# Patient Record
Sex: Male | Born: 1995 | Race: White | Hispanic: No | Marital: Single | Smoking: Never smoker
Health system: Southern US, Community
[De-identification: ages and names within clinical notes are randomized; demographics above are authoritative.]

## PROBLEM LIST (undated history)

## (undated) DIAGNOSIS — I861 Scrotal varices: Secondary | ICD-10-CM

## (undated) DIAGNOSIS — F909 Attention-deficit hyperactivity disorder, unspecified type: Secondary | ICD-10-CM

## (undated) HISTORY — PX: MYRINGOTOMY: SHX2060

## (undated) HISTORY — DX: Attention-deficit hyperactivity disorder, unspecified type: F90.9

## (undated) HISTORY — DX: Scrotal varices: I86.1

---
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
## 2018-07-18 ENCOUNTER — Emergency Department (HOSPITAL_COMMUNITY): Payer: Self-pay

## 2018-07-18 ENCOUNTER — Emergency Department (HOSPITAL_COMMUNITY)
Admission: EM | Admit: 2018-07-18 | Discharge: 2018-07-18 | Disposition: A | Discharge status: Home / Self Care | Payer: Self-pay | Attending: Emergency Medicine | Admitting: Emergency Medicine

## 2018-07-18 ENCOUNTER — Other Ambulatory Visit: Payer: Self-pay

## 2018-07-18 DIAGNOSIS — F1910 Other psychoactive substance abuse, uncomplicated: Secondary | ICD-10-CM | POA: Insufficient documentation

## 2018-07-18 DIAGNOSIS — R509 Fever, unspecified: Secondary | ICD-10-CM

## 2018-07-18 DIAGNOSIS — Z79899 Other long term (current) drug therapy: Secondary | ICD-10-CM | POA: Insufficient documentation

## 2018-07-18 DIAGNOSIS — F17200 Nicotine dependence, unspecified, uncomplicated: Secondary | ICD-10-CM | POA: Insufficient documentation

## 2018-07-18 LAB — COMPREHENSIVE METABOLIC PANEL
ALT: 20 U/L (ref 0–44)
AST: 16 U/L (ref 15–41)
Albumin: 4.7 g/dL (ref 3.5–5.0)
Alkaline Phosphatase: 53 U/L (ref 38–126)
Anion gap: 9 (ref 5–15)
BUN: 14 mg/dL (ref 6–20)
CO2: 28 mmol/L (ref 22–32)
Calcium: 9.7 mg/dL (ref 8.9–10.3)
Chloride: 104 mmol/L (ref 98–111)
Creatinine, Ser: 1.1 mg/dL (ref 0.61–1.24)
GFR calc Af Amer: >60 mL/min (ref >60)
GFR calc non Af Amer: >60 mL/min (ref >60)
Glucose, Bld: 76 mg/dL (ref 70–99)
Potassium: 3.7 mmol/L (ref 3.5–5.1)
Sodium: 141 mmol/L (ref 135–145)
Total Bilirubin: 0.9 mg/dL (ref 0.3–1.2)
Total Protein: 7.3 g/dL (ref 6.5–8.1)

## 2018-07-18 LAB — URINALYSIS, ROUTINE W REFLEX MICROSCOPIC
Glucose, UA: NEGATIVE mg/dL
Hgb urine dipstick: NEGATIVE
Ketones, ur: 5 mg/dL — AB
Leukocytes,Ua: NEGATIVE
Nitrite: NEGATIVE
Protein, ur: 30 mg/dL — AB
Specific Gravity, Urine: 1.03 (ref 1.005–1.030)
pH: 5 (ref 5.0–8.0)

## 2018-07-18 LAB — CBC WITH DIFFERENTIAL/PLATELET
Abs Immature Granulocytes: 0.01 10*3/uL (ref 0.00–0.07)
Basophils Absolute: 0 10*3/uL (ref 0.0–0.1)
Basophils Relative: 1 %
Eosinophils Absolute: 0.2 10*3/uL (ref 0.0–0.5)
Eosinophils Relative: 4 %
HCT: 40.7 % (ref 39.0–52.0)
Hemoglobin: 13.7 g/dL (ref 13.0–17.0)
Immature Granulocytes: 0 %
Lymphocytes Relative: 35 %
Lymphs Abs: 2.3 10*3/uL (ref 0.7–4.0)
MCH: 31.5 pg (ref 26.0–34.0)
MCHC: 33.7 g/dL (ref 30.0–36.0)
MCV: 93.6 fL (ref 80.0–100.0)
Monocytes Absolute: 0.9 10*3/uL (ref 0.1–1.0)
Monocytes Relative: 13 %
Neutro Abs: 3.2 10*3/uL (ref 1.7–7.7)
Neutrophils Relative %: 47 %
Platelets: 205 10*3/uL (ref 150–400)
RBC: 4.35 MIL/uL (ref 4.22–5.81)
RDW: 11.8 % (ref 11.5–15.5)
WBC: 6.6 10*3/uL (ref 4.0–10.5)
nRBC: 0 % (ref 0.0–0.2)

## 2018-07-18 LAB — SALICYLATE LEVEL: Salicylate Lvl: <7 mg/dL (ref 2.8–30.0)

## 2018-07-18 LAB — ACETAMINOPHEN LEVEL: Acetaminophen (Tylenol), Serum: <10 ug/mL — ABNORMAL LOW (ref 10–30)

## 2018-07-18 LAB — CBG MONITORING, ED: Glucose-Capillary: 71 mg/dL (ref 70–99)

## 2018-07-18 LAB — RAPID URINE DRUG SCREEN, HOSP PERFORMED
Amphetamines: NOT DETECTED
Barbiturates: NOT DETECTED
Cocaine: POSITIVE — AB
Opiates: POSITIVE — AB
Tetrahydrocannabinol: NOT DETECTED

## 2018-07-18 LAB — ETHANOL: Alcohol, Ethyl (B): <10 mg/dL (ref <10)

## 2018-07-18 NOTE — ED Notes (Signed)
Patient ambulatory in hall and instructed to get back into bed. Provided patient with non-slip socks and placed side rails up. Notified patient's RN.

## 2018-07-18 NOTE — ED Notes (Signed)
Urine and culture sent to lab  

## 2018-07-18 NOTE — ED Notes (Addendum)
Pt alert and ambulatory. Pt stated he is ready to leave. MD notified and at bedside. Pt calling for a ride.
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
## 2018-07-18 NOTE — ED Notes (Signed)
EKG given to EDP,Cardama,MD. For review. 

## 2018-07-18 NOTE — ED Notes (Addendum)
Pt. Documented in error DG Chest Port 1 View. 

## 2018-07-18 NOTE — ED Notes (Signed)
Pt verbalized discharge instructions and follow up care. Alert and ambulatory. No IV. Friend is picking pt up

## 2018-07-18 NOTE — ED Notes (Signed)
XR at bedside
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
## 2018-07-18 NOTE — ED Notes (Addendum)
Pt unable to provide urine right now

## 2018-11-13 ENCOUNTER — Other Ambulatory Visit: Payer: Self-pay

## 2018-11-13 ENCOUNTER — Encounter (HOSPITAL_COMMUNITY): Payer: Self-pay | Admitting: Emergency Medicine

## 2018-11-13 ENCOUNTER — Ambulatory Visit (HOSPITAL_COMMUNITY)
Admission: EM | Admit: 2018-11-13 | Discharge: 2018-11-13 | Disposition: A | Discharge status: Home / Self Care | Payer: Self-pay | Attending: Emergency Medicine | Admitting: Emergency Medicine

## 2018-11-13 DIAGNOSIS — L02411 Cutaneous abscess of right axilla: Secondary | ICD-10-CM | POA: Insufficient documentation

## 2018-11-13 MED ORDER — IBUPROFEN 600 MG PO TABS: 600.0000 mg | ORAL_TABLET | Freq: Four times a day (QID) | ORAL | 0 refills | Status: DC | PRN

## 2018-11-13 MED ORDER — LIDOCAINE-EPINEPHRINE (PF) 2 %-1:200000 IJ SOLN
INTRAMUSCULAR | Status: AC
  Filled 2018-11-13: qty 20

## 2018-11-13 MED ORDER — DOXYCYCLINE HYCLATE 100 MG PO CAPS: 100.0000 mg | ORAL_CAPSULE | Freq: Two times a day (BID) | ORAL | 0 refills | Status: AC

## 2018-11-13 NOTE — ED Provider Notes (Signed)
HPI  SUBJECTIVE:  Thomas Walter is a 23 y.o. male who presents with a painful erythematous mass of gradually increasing size in his right axilla for the past 3 to 4 days.  He reports some expressible purulent drainage.  He does not shave his axilla.  No trauma to the area.  No fevers, body aches, malaise.  No contacts with MRSA.  He tried squeezing it, applying alcohol, anesthetic spray and antibiotic cream without improvement of symptoms.  No alleviating factors.  Symptoms are worse with palpation.  He has a past medical history of polysubstance abuse, denies IV drug use, MRSA, diabetes, HIV.  [facility]:ENIDPOE, Everardo All, MD    Past Medical History:  Diagnosis Date  . ADD (attention deficit disorder with hyperactivity)   . Left varicocele     Past Surgical History:  Procedure Laterality Date  . MYRINGOTOMY     CROSSLEY    History reviewed. No pertinent family history.  Social History   Tobacco Use  . Smoking status: Light Tobacco Smoker  . Smokeless tobacco: Never Used  Substance Use Topics  . Alcohol use: No  . Drug use: No    No current facility-administered medications for this encounter.   Current Outpatient Medications:  .  doxycycline (VIBRAMYCIN) 100 MG capsule, Take 1 capsule (100 mg total) by mouth 2 (two) times daily for 5 days., Disp: 10 capsule, Rfl: 0 .  ibuprofen (ADVIL) 600 MG tablet, Take 1 tablet (600 mg total) by mouth every 6 (six) hours as needed., Disp: 30 tablet, Rfl: 0  Allergies  Allergen Reactions  . Amoxicillin [Amoxicillin]   . Amoxicillin-Pot Clavulanate      ROS  As noted in HPI.   Physical Exam  BP 116/70 (BP Location: Right Arm)   Pulse 73   Temp 98.1 F (36.7 C) (Temporal)   Resp 18   SpO2 100%   Constitutional: Well developed, well nourished, no acute distress Eyes:  EOMI, conjunctiva normal bilaterally HENT: Normocephalic, atraumatic,mucus membranes moist Respiratory: Normal inspiratory effort Cardiovascular: Normal rate GI:  nondistended skin: 3 x 4 cm tender area of erythema, induration with small amount of central fluctuance in the right axilla.  Positive expressible purulent drainage.    Musculoskeletal: no deformities Neurologic: Alert & oriented x 3, no focal neuro deficits Psychiatric: Speech and behavior appropriate   ED Course   Medications  lidocaine-EPINEPHrine (XYLOCAINE W/EPI) 2 %-1:200000 (PF) injection (has no administration in time range)    Orders Placed This Encounter  Procedures  . Aerobic Culture (superficial specimen)    Standing Status:   Standing    Number of Occurrences:   1    Order Specific Question:   Patient immune status    Answer:   Normal    No results found for this or any previous visit (from the past 24 hour(s)). No results found.  ED Clinical Impression  1. Abscess of axilla, right      ED Assessment/Plan  Procedure note:  Cleaned area with alcohol.  Then used 0.5 cc of lidocaine 2% with epinephrine by local infiltration with adequate anesthesia.  Made a single stab blade with a 11 blade.  Expressed a small amount of purulent drainage.  Then explored the wound to break up loculations.  Irrigated out with 120 cc of sterile saline.  Packing placed.  Culture sent.  Dressing placed.  Patient tolerated procedure well.  We will have patient return here or follow-up with his primary care physician in 2 days for packing  removal, wound check.  Home with doxycycline, Tylenol/ibuprofen combination.  To the ER if he gets worse in the interim.   Discussed labs,  MDM, plan and followup with patient. Discussed sn/sx that should prompt return to the  ED. Patient agrees with plan.   Meds ordered this encounter  Medications  . ibuprofen (ADVIL) 600 MG tablet    Sig: Take 1 tablet (600 mg total) by mouth every 6 (six) hours as needed.    Dispense:  30 tablet    Refill:  0  . doxycycline (VIBRAMYCIN) 100 MG capsule    Sig: Take 1 capsule (100 mg total) by mouth 2 (two)  times daily for 5 days.    Dispense:  10 capsule    Refill:  0    *This clinic note was created using [employer]. Therefore, there may be occasional mistakes despite careful proofreading.  ?    Melynda Ripple, MD 11/13/18 [phone]

## 2018-11-13 NOTE — ED Triage Notes (Signed)
Pt here for abscess under right axillary area

## 2018-11-13 NOTE — Discharge Instructions (Addendum)
Return here or follow up with your doctor in 2 days for a wound check. Give [place] a working phone number so that we contact you if we need to change your antibiotics. Take the medication as written. Take 1 gram of tylenol with the 600 mg motrin up to 4 times a day as needed for pain and fever. This is an effective combination for pain. Return to the ER if you get worse, have a persistent fever >100.4, or for any concerns.   Go to www.goodrx.com to look up your medications. This will give you a list of where you can find your prescriptions at the most affordable prices. Or ask the pharmacist what the cash price is, or if they have any other discount programs available to help make your medication more affordable. This can be less expensive than what you would pay with insurance.

## 2018-11-15 LAB — AEROBIC CULTURE W GRAM STAIN (SUPERFICIAL SPECIMEN): Special Requests: NORMAL

## 2018-11-17 ENCOUNTER — Telehealth (HOSPITAL_COMMUNITY): Payer: Self-pay | Admitting: Emergency Medicine

## 2018-11-17 NOTE — Telephone Encounter (Signed)
Attempted to contact pt to see how he was feeling, for follow up, no answer, left VM

## 2021-02-23 DIAGNOSIS — Z79899 Other long term (current) drug therapy: Secondary | ICD-10-CM | POA: Insufficient documentation

## 2021-02-23 DIAGNOSIS — X58XXXA Exposure to other specified factors, initial encounter: Secondary | ICD-10-CM | POA: Insufficient documentation

## 2021-02-23 DIAGNOSIS — S40852A Superficial foreign body of left upper arm, initial encounter: Secondary | ICD-10-CM | POA: Insufficient documentation

## 2021-02-23 DIAGNOSIS — Z23 Encounter for immunization: Secondary | ICD-10-CM | POA: Insufficient documentation

## 2021-02-24 ENCOUNTER — Other Ambulatory Visit: Payer: Self-pay

## 2021-02-24 ENCOUNTER — Emergency Department (HOSPITAL_COMMUNITY)
Admission: EM | Admit: 2021-02-24 | Discharge: 2021-02-24 | Disposition: A | Discharge status: Home / Self Care | Payer: Self-pay | Attending: Emergency Medicine | Admitting: Emergency Medicine

## 2021-02-24 ENCOUNTER — Emergency Department (HOSPITAL_COMMUNITY): Payer: Self-pay

## 2021-02-24 ENCOUNTER — Encounter (HOSPITAL_COMMUNITY): Payer: Self-pay | Admitting: Emergency Medicine

## 2021-02-24 DIAGNOSIS — M795 Residual foreign body in soft tissue: Secondary | ICD-10-CM

## 2021-02-24 DIAGNOSIS — M79602 Pain in left arm: Secondary | ICD-10-CM

## 2021-02-24 MED ORDER — TETANUS-DIPHTH-ACELL PERTUSSIS 5-2.5-18.5 LF-MCG/0.5 IM SUSY
0.5000 mL | PREFILLED_SYRINGE | Freq: Once | INTRAMUSCULAR | Status: AC
  Administered 2021-02-24: 0.5 mL via INTRAMUSCULAR
  Filled 2021-02-24: qty 0.5

## 2021-02-24 MED ORDER — DOXYCYCLINE HYCLATE 100 MG PO CAPS: 100.0000 mg | ORAL_CAPSULE | Freq: Two times a day (BID) | ORAL | 0 refills | Status: DC

## 2021-02-24 NOTE — ED Provider Notes (Signed)
[facility] EMERGENCY DEPARTMENT Provider Note   CSN: [id number] Arrival date & time: 02/23/21  2245     History  Chief Complaint  Patient presents with   Foreign Body    Thomas Walter is a 26 y.o. male.   Foreign Body Associated symptoms: no congestion   Patient is a 26 year old male presented emergency room today with complaints of left forearm foreign body.  He was injecting heroin 3 hours prior to arrival in the emergency room and the needle broke.  He denies any pain he denies any fevers chills nausea vomiting.  No discomfort or symptoms urine emergency room.  He is uncertain of last tetanus vaccination.  No other associate symptoms.    Home Medications Prior to Admission medications   Medication Sig Start Date End Date Taking? Authorizing Provider  doxycycline (VIBRAMYCIN) 100 MG capsule Take 1 capsule (100 mg total) by mouth 2 (two) times daily. 02/24/21  Yes Janet Decesare S, PA  ibuprofen (ADVIL) 600 MG tablet Take 1 tablet (600 mg total) by mouth every 6 (six) hours as needed. 11/13/18   Melynda Ripple, MD  amphetamine-dextroamphetamine (ADDERALL XR) 20 MG 24 hr capsule Take 1 capsule (20 mg total) by mouth every morning. 01/01/13 11/13/18  Denita Lung, MD      Allergies    Amoxicillin [amoxicillin] and Amoxicillin-pot clavulanate    Review of Systems   Review of Systems  Constitutional:  Negative for fever.  HENT:  Negative for congestion.   Respiratory:  Negative for shortness of breath.   Cardiovascular:  Negative for chest pain.  Gastrointestinal:  Negative for abdominal distention.  Skin:        Foreign body  Neurological:  Negative for dizziness and headaches.   Physical Exam Updated Vital Signs BP 115/70 (BP Location: Right Arm)    Pulse 97    Temp 99.3 F (37.4 C)    Resp 16    SpO2 100%  Physical Exam Vitals and nursing note reviewed.  Constitutional:      General: He is not in acute distress.    Appearance: Normal  appearance. He is not ill-appearing.  HENT:     Head: Normocephalic and atraumatic.  Eyes:     General: No scleral icterus.       Right eye: No discharge.        Left eye: No discharge.     Conjunctiva/sclera: Conjunctivae normal.  Pulmonary:     Effort: Pulmonary effort is normal.     Breath sounds: No stridor.  Skin:    General: Skin is warm and dry.     Comments: Track marks on bilateral arms.  Neurological:     Mental Status: He is alert and oriented to person, place, and time. Mental status is at baseline.    ED Results / Procedures / Treatments   Labs (all labs ordered are listed, but only abnormal results are displayed) Labs Reviewed - No data to display  EKG None  Radiology DG Chest 2 View  Result Date: 02/24/2021 CLINICAL DATA:  Needle broke in [facility]. EXAM: CHEST - 2 VIEW COMPARISON:  Jul 18, 2018 FINDINGS: The heart size and mediastinal contours are within normal limits. Both lungs are clear. The visualized skeletal structures are unremarkable. IMPRESSION: No active cardiopulmonary disease. Electronically Signed   By: Virgina Norfolk M.D.   On: 02/24/2021 01:37   DG Forearm Left  Result Date: 02/24/2021 CLINICAL DATA:  Needle broke in [facility]. EXAM: LEFT FOREARM -  2 VIEW COMPARISON:  None. FINDINGS: There is no evidence of fracture or other focal bone lesions. A thin, linear 1.0 cm radiopaque foreign body is seen within the soft tissues of the left antecubital fossa. IMPRESSION: Thin needle fragment within the soft tissues of the left antecubital fossa. Electronically Signed   By: Virgina Norfolk M.D.   On: 02/24/2021 01:39   DG Humerus Left  Result Date: 02/24/2021 CLINICAL DATA:  Broken needle in [facility]. EXAM: LEFT HUMERUS - 2+ VIEW COMPARISON:  None. FINDINGS: There is no evidence of fracture or other focal bone lesions. A thin, linear 1.0 cm radiopaque foreign body is seen within the soft tissues of the left antecubital fossa. IMPRESSION: Thin needle fragment within the soft  tissues of the left antecubital fossa. Electronically Signed   By: Virgina Norfolk M.D.   On: 02/24/2021 01:39    Procedures Procedures    Medications Ordered in ED Medications  Tdap (BOOSTRIX) injection 0.5 mL (0.5 mLs Intramuscular Given 02/24/21 1141)    ED Course/ Medical Decision Making/ A&P                           Medical Decision Making  I personally reviewed x-rays from the patient's ER visit.  He does have a small metallic foreign body consistent with the broken needle in his left AC.  I was unable to palpate this on exam it seems that it is likely not superficial enough to remove in the emergency room.  It is not causing him any symptoms or discomfort.  Briefly discussed my attending physician who recommended discharge with antibiotics.  Updated patient on tetanus and will discharge home home with doxycycline as prophylaxis.  Patient agreeable to plan.  I also provided patient with referral to general surgery should he prefer to be evaluated and considered for foreign body removal.  He understands that it may be difficult to remove.  Final Clinical Impression(s) / ED Diagnoses Final diagnoses:  Left arm pain  Foreign body (FB) in soft tissue    Rx / DC Orders ED Discharge Orders          Ordered    doxycycline (VIBRAMYCIN) 100 MG capsule  2 times daily        02/24/21 1127              Pati Gallo [place], [place] 02/25/21 1226    Tegeler, Gwenyth Allegra, MD 02/26/21 [phone]

## 2021-02-24 NOTE — Discharge Instructions (Addendum)
Your TDAP was updated today.  Take antibiotics as prescribed.  Follow up with general surgery.

## 2021-02-24 NOTE — ED Provider Triage Note (Signed)
Emergency Medicine Provider Triage Evaluation Note  Thomas Walter , a 26 y.o. male  was evaluated in triage.  Pt complains of needle breaking off while injecting heroin into left AC.  States this occurred a few hours prior.  Patient states that he originally was able to feel the broken piece of metal but was unable to remove it.  Patient denies any pain, chest pain, shortness of breath.  Patient is right-hand dominant.  Review of Systems  Positive:  Negative: Chest pain, shortness of breath, myalgia  Physical Exam  BP 90/63 (BP Location: Right Arm)    Pulse 79    Resp 15    SpO2 97%  Gen:   Awake, no distress   Resp:  Normal effort  MSK:   Moves extremities without difficulty, patient has full range of motion to left .  Puncture wounds noted to left AC.  Unable to palpate any foreign objects. Other:  +2 left radial pulse  Medical Decision Making  Medically screening exam initiated at 12:59 AM.  Appropriate orders placed.  Thomas A Desai was informed that the remainder of the evaluation will be completed by another provider, this initial triage assessment does not replace that evaluation, and the importance of remaining in the ED until their evaluation is complete.  Will obtain x-ray imaging to evaluate for foreign body.   Haskel Schroeder, [place] 02/24/21 0101

## 2021-02-24 NOTE — ED Triage Notes (Signed)
Patient states that he had a needle break off in his vein on the [facility] of left arm about 3 hours ago while injecting heroin.

## 2022-04-20 ENCOUNTER — Emergency Department (HOSPITAL_BASED_OUTPATIENT_CLINIC_OR_DEPARTMENT_OTHER): Payer: Medicaid Other | Admitting: Radiology

## 2022-04-20 ENCOUNTER — Emergency Department (HOSPITAL_BASED_OUTPATIENT_CLINIC_OR_DEPARTMENT_OTHER)
Admission: EM | Admit: 2022-04-20 | Discharge: 2022-04-21 | Disposition: A | Discharge status: Home / Self Care | Payer: Medicaid Other | Attending: Emergency Medicine | Admitting: Emergency Medicine

## 2022-04-20 ENCOUNTER — Encounter (HOSPITAL_BASED_OUTPATIENT_CLINIC_OR_DEPARTMENT_OTHER): Payer: Self-pay | Admitting: Emergency Medicine

## 2022-04-20 ENCOUNTER — Other Ambulatory Visit: Payer: Self-pay

## 2022-04-20 DIAGNOSIS — R0789 Other chest pain: Secondary | ICD-10-CM | POA: Diagnosis present

## 2022-04-20 DIAGNOSIS — K5900 Constipation, unspecified: Secondary | ICD-10-CM

## 2022-04-20 LAB — CBC WITH DIFFERENTIAL/PLATELET
Abs Immature Granulocytes: 0.01 10*3/uL (ref 0.00–0.07)
Basophils Absolute: 0 10*3/uL (ref 0.0–0.1)
Basophils Relative: 0 %
Eosinophils Absolute: 0.1 10*3/uL (ref 0.0–0.5)
Eosinophils Relative: 1 %
HCT: 38.1 % — ABNORMAL LOW (ref 39.0–52.0)
Hemoglobin: 13.4 g/dL (ref 13.0–17.0)
Immature Granulocytes: 0 %
Lymphocytes Relative: 16 %
Lymphs Abs: 1 10*3/uL (ref 0.7–4.0)
MCH: 30.4 pg (ref 26.0–34.0)
MCHC: 35.2 g/dL (ref 30.0–36.0)
MCV: 86.4 fL (ref 80.0–100.0)
Monocytes Absolute: 0.6 10*3/uL (ref 0.1–1.0)
Monocytes Relative: 9 %
Neutro Abs: 4.6 10*3/uL (ref 1.7–7.7)
Neutrophils Relative %: 74 %
Platelets: 216 10*3/uL (ref 150–400)
RBC: 4.41 MIL/uL (ref 4.22–5.81)
RDW: 12.3 % (ref 11.5–15.5)
WBC: 6.3 10*3/uL (ref 4.0–10.5)
nRBC: 0 % (ref 0.0–0.2)

## 2022-04-20 NOTE — ED Notes (Signed)
Patient to x-ray via wheel chair

## 2022-04-20 NOTE — ED Triage Notes (Addendum)
Patient reports constipation x 1 month, small BM yesterday.  Patient reports taking OTC meds.  Patient reports chest pain that started 3 hours ago "when the laxative kicked in," and left arm pain that started 1 hour ago. During triage, patient reports "it's getting hard to talk, I feel like I'm going to pass out, and my left buttcheek feels like it's going to blow off."  Patient reports taking 2 baby ASA pta.

## 2022-04-21 ENCOUNTER — Encounter (HOSPITAL_BASED_OUTPATIENT_CLINIC_OR_DEPARTMENT_OTHER): Payer: Self-pay

## 2022-04-21 LAB — COMPREHENSIVE METABOLIC PANEL
ALT: 14 U/L (ref 0–44)
AST: 13 U/L — ABNORMAL LOW (ref 15–41)
Albumin: 4.5 g/dL (ref 3.5–5.0)
Alkaline Phosphatase: 73 U/L (ref 38–126)
Anion gap: 10 (ref 5–15)
BUN: 9 mg/dL (ref 6–20)
CO2: 26 mmol/L (ref 22–32)
Calcium: 10 mg/dL (ref 8.9–10.3)
Chloride: 101 mmol/L (ref 98–111)
Creatinine, Ser: 0.98 mg/dL (ref 0.61–1.24)
GFR, Estimated: >60 mL/min (ref >60)
Glucose, Bld: 110 mg/dL — ABNORMAL HIGH (ref 70–99)
Potassium: 3.7 mmol/L (ref 3.5–5.1)
Sodium: 137 mmol/L (ref 135–145)
Total Bilirubin: 0.5 mg/dL (ref 0.3–1.2)
Total Protein: 8 g/dL (ref 6.5–8.1)

## 2022-04-21 LAB — LIPASE, BLOOD: Lipase: 14 U/L (ref 11–51)

## 2022-04-21 LAB — TROPONIN I (HIGH SENSITIVITY): Troponin I (High Sensitivity): <2 ng/L (ref <18)

## 2022-04-21 NOTE — ED Provider Notes (Signed)
[facility] Provider Note   CSN: [id number] Arrival date & time: 04/20/22  2318     History  Chief Complaint  Patient presents with   Chest Pain   Constipation    Thomas Walter is a 27 y.o. male.  HPI     This 27 year old male who presents with concerns for chest pain.  Patient reports that he has had a 2-week history of worsening constipation.  He did have a bowel movement yesterday but it was hard.  He took a stimulant laxative several hours ago and after about 30 minutes began to have multiple symptoms including chest pain.  He states he was having chest discomfort and left arm discomfort.  He also states that he felt like his bowels were moving but "then they just stopped."  He felt like his heart was racing.  EMS was called and his blood pressure was elevated.  They recommended he be evaluated.  He has not had any recent illnesses.  No history of surgeries.  Denies nausea or vomiting.  Home Medications Prior to Admission medications   Medication Sig Start Date End Date Taking? Authorizing Provider  doxycycline (VIBRAMYCIN) 100 MG capsule Take 1 capsule (100 mg total) by mouth 2 (two) times daily. 02/24/21   Tedd Sias, PA  ibuprofen (ADVIL) 600 MG tablet Take 1 tablet (600 mg total) by mouth every 6 (six) hours as needed. 11/13/18   Melynda Ripple, MD  amphetamine-dextroamphetamine (ADDERALL XR) 20 MG 24 hr capsule Take 1 capsule (20 mg total) by mouth every morning. 01/01/13 11/13/18  Denita Lung, MD      Allergies    Amoxicillin [amoxicillin] and Amoxicillin-pot clavulanate    Review of Systems   Review of Systems  Constitutional:  Negative for fever.  Respiratory:  Negative for shortness of breath.   Cardiovascular:  Positive for chest pain.  Gastrointestinal:  Positive for constipation.  All other systems reviewed and are negative.   Physical Exam Updated Vital Signs BP 123/79   Pulse 99   Temp 98.7 F (37.1  C) (Oral)   Resp 13   Ht 1.803 m ('5\' 11"'$ )   Wt 68 kg   SpO2 98%   BMI 20.92 kg/m  Physical Exam Vitals and nursing note reviewed.  Constitutional:      Appearance: He is well-developed. He is not ill-appearing.  HENT:     Head: Normocephalic and atraumatic.     Mouth/Throat:     Mouth: Mucous membranes are moist.     Comments: Poor dentition Eyes:     Pupils: Pupils are equal, round, and reactive to light.  Cardiovascular:     Rate and Rhythm: Normal rate and regular rhythm.     Heart sounds: Normal heart sounds. No murmur heard. Pulmonary:     Effort: Pulmonary effort is normal. No respiratory distress.     Breath sounds: Normal breath sounds. No wheezing.  Abdominal:     General: Bowel sounds are normal.     Palpations: Abdomen is soft.     Tenderness: There is abdominal tenderness. There is no guarding or rebound.     Comments: Mild generalized tenderness palpation, no focal tenderness or signs of peritonitis  Musculoskeletal:     Cervical back: Neck supple.  Lymphadenopathy:     Cervical: No cervical adenopathy.  Skin:    General: Skin is warm and dry.  Neurological:     Mental Status: He is alert and oriented to  person, place, and time.  Psychiatric:        Mood and Affect: Mood normal.     ED Results / Procedures / Treatments   Labs (all labs ordered are listed, but only abnormal results are displayed) Labs Reviewed  CBC WITH DIFFERENTIAL/PLATELET - Abnormal; Notable for the following components:      Result Value   HCT 38.1 (*)    All other components within normal limits  COMPREHENSIVE METABOLIC PANEL - Abnormal; Notable for the following components:   Glucose, Bld 110 (*)    AST 13 (*)    All other components within normal limits  LIPASE, BLOOD  TROPONIN I (HIGH SENSITIVITY)    EKG EKG Interpretation  Date/Time:  Friday April 20 2022 23:27:08 EST Ventricular Rate:  84 PR Interval:  125 QRS Duration: 94 QT Interval:  366 QTC  Calculation: 433 R Axis:   85 Text Interpretation: Sinus rhythm Confirmed by Thayer Jew [id number]) on 04/21/2022 12:19:10 AM  Radiology DG Abdomen Acute W/Chest  Result Date: 04/21/2022 CLINICAL DATA:  Chest pain, constipation EXAM: DG ABDOMEN ACUTE WITH 1 VIEW CHEST COMPARISON:  03/06/2021 FINDINGS: Lungs are clear.  No pleural effusion or pneumothorax. The heart is normal in size. Nonobstructive bowel gas pattern. No evidence of free air under the diaphragm on the upright view. Moderate colonic stool burden, suggesting mild constipation. Visualized osseous structures are within normal limits. IMPRESSION: No evidence of acute cardiopulmonary disease. Moderate colonic stool burden, suggesting mild constipation. Electronically Signed   By: Julian Hy M.D.   On: 04/21/2022 00:10    Procedures Procedures    Medications Ordered in ED Medications - No data to display  ED Course/ Medical Decision Making/ A&P                             Medical Decision Making Amount and/or Complexity of Data Reviewed Labs: ordered. Radiology: ordered.   This patient presents to the ED for concern of constipation, chest pain and this involves an extensive number of treatment options, and is a complaint that carries with it a high risk of complications and morbidity.  I considered the following differential and admission for this acute, potentially life threatening condition.  The differential diagnosis includes constipation, effects related to stimulant laxative use, gas bubble in the GI tract, less likely ACS  MDM:    This is a 27 year old male who presents with constipation.  Also concern for chest discomfort after taking a stimulant laxative.  He is nontoxic and vital signs are reassuring.  Blood pressure here is 131/88.  He is afebrile.  He has some mild diffuse tenderness but no focal signs of peritonitis.  EKG shows no evidence of acute ischemia or arrhythmia.  It is normal.  Acute abdominal  series without intra-abdominal air.  No pneumothorax or pneumonia.  He has some mild constipation.  Labs obtained and reviewed.  Troponin x 1 negative.  Feel that this is enough to restratification ACS given his age and story.  CBC, CMP, lipase are all normal as well.  On recheck, he states he feels much better.  He states that his symptoms "went away almost instantly."  Recommend that he avoid stimulant laxative use given onset of symptoms and correlation with taking stimulant laxative.  He can trial MiraLAX.  Recommend increasing fluids and avoiding constipating medications and/or drugs.  (Labs, imaging, consults)  Labs: I Ordered, and personally interpreted labs.  The pertinent results  include: CBC, CMP, troponin, lipase  Imaging Studies ordered: I ordered imaging studies including acute abdominal series I independently visualized and interpreted imaging. I agree with the radiologist interpretation  Additional history obtained from family at bedside.  External records from outside source obtained and reviewed including prior evaluations  Cardiac Monitoring: The patient was maintained on a cardiac monitor.  If on the cardiac monitor, I personally viewed and interpreted the cardiac monitored which showed an underlying rhythm of: Sinus rhythm  Reevaluation: After the interventions noted above, I reevaluated the patient and found that they have :resolved  Social Determinants of Health:  lives independently  Disposition: Discharge  Co morbidities that complicate the patient evaluation  Past Medical History:  Diagnosis Date   ADD (attention deficit disorder with hyperactivity)    Left varicocele      Medicines No orders of the defined types were placed in this encounter.   I have reviewed the patients home medicines and have made adjustments as needed  Problem List / ED Course: Problem List Items Addressed This Visit   None Visit Diagnoses     Constipation, unspecified  constipation type    -  Primary   Atypical chest pain                       Final Clinical Impression(s) / ED Diagnoses Final diagnoses:  Constipation, unspecified constipation type  Atypical chest pain    Rx / DC Orders ED Discharge Orders     None         Merryl Hacker, MD 04/21/22 [phone]

## 2022-04-21 NOTE — Discharge Instructions (Signed)
You were seen today for constipation and atypical chest pain.  Your symptoms are likely related to the stimulant laxative that you took.  Avoid this in the future.  Your workup is reassuring.  You do have evidence of mild constipation on your x-ray.  You may take 1 capful of MiraLAX twice daily until your bowel movements are regular and soft.  Avoid constipating medications or drugs.  Additionally, increase your fluid intake.

## 2022-05-14 ENCOUNTER — Emergency Department (HOSPITAL_COMMUNITY): Payer: 59

## 2022-05-14 ENCOUNTER — Other Ambulatory Visit: Payer: Self-pay

## 2022-05-14 ENCOUNTER — Emergency Department (HOSPITAL_COMMUNITY)
Admission: EM | Admit: 2022-05-14 | Discharge: 2022-05-14 | Disposition: A | Discharge status: Home / Self Care | Payer: 59 | Attending: Emergency Medicine | Admitting: Emergency Medicine

## 2022-05-14 DIAGNOSIS — R791 Abnormal coagulation profile: Secondary | ICD-10-CM | POA: Insufficient documentation

## 2022-05-14 DIAGNOSIS — R7989 Other specified abnormal findings of blood chemistry: Secondary | ICD-10-CM | POA: Diagnosis not present

## 2022-05-14 DIAGNOSIS — R091 Pleurisy: Secondary | ICD-10-CM | POA: Diagnosis not present

## 2022-05-14 DIAGNOSIS — L03114 Cellulitis of left upper limb: Secondary | ICD-10-CM | POA: Insufficient documentation

## 2022-05-14 DIAGNOSIS — R079 Chest pain, unspecified: Secondary | ICD-10-CM | POA: Diagnosis not present

## 2022-05-14 DIAGNOSIS — R0789 Other chest pain: Secondary | ICD-10-CM | POA: Diagnosis not present

## 2022-05-14 DIAGNOSIS — R000 Tachycardia, unspecified: Secondary | ICD-10-CM | POA: Diagnosis not present

## 2022-05-14 DIAGNOSIS — F1990 Other psychoactive substance use, unspecified, uncomplicated: Secondary | ICD-10-CM | POA: Diagnosis present

## 2022-05-14 DIAGNOSIS — F1110 Opioid abuse, uncomplicated: Secondary | ICD-10-CM | POA: Diagnosis present

## 2022-05-14 LAB — CBC
HCT: 39.9 % (ref 39.0–52.0)
Hemoglobin: 13.4 g/dL (ref 13.0–17.0)
MCH: 29.8 pg (ref 26.0–34.0)
MCHC: 33.6 g/dL (ref 30.0–36.0)
MCV: 88.9 fL (ref 80.0–100.0)
Platelets: 216 10*3/uL (ref 150–400)
RBC: 4.49 MIL/uL (ref 4.22–5.81)
RDW: 12.1 % (ref 11.5–15.5)
WBC: 4.8 10*3/uL (ref 4.0–10.5)
nRBC: 0 % (ref 0.0–0.2)

## 2022-05-14 LAB — BASIC METABOLIC PANEL
Anion gap: 9 (ref 5–15)
BUN: 9 mg/dL (ref 6–20)
CO2: 27 mmol/L (ref 22–32)
Calcium: 9.3 mg/dL (ref 8.9–10.3)
Chloride: 100 mmol/L (ref 98–111)
Creatinine, Ser: 0.98 mg/dL (ref 0.61–1.24)
GFR, Estimated: >60 mL/min (ref >60)
Glucose, Bld: 89 mg/dL (ref 70–99)
Potassium: 4 mmol/L (ref 3.5–5.1)
Sodium: 136 mmol/L (ref 135–145)

## 2022-05-14 LAB — LACTIC ACID, PLASMA: Lactic Acid, Venous: 0.9 mmol/L (ref 0.5–1.9)

## 2022-05-14 LAB — TROPONIN I (HIGH SENSITIVITY)
Troponin I (High Sensitivity): <2 ng/L (ref <18)
Troponin I (High Sensitivity): <2 ng/L (ref <18)

## 2022-05-14 LAB — D-DIMER, QUANTITATIVE: D-Dimer, Quant: 0.75 ug/mL-FEU — ABNORMAL HIGH (ref 0.00–0.50)

## 2022-05-14 MED ORDER — SODIUM CHLORIDE 0.9 % IV BOLUS
1000.0000 mL | Freq: Once | INTRAVENOUS | Status: AC
  Administered 2022-05-14: 1000 mL via INTRAVENOUS

## 2022-05-14 MED ORDER — IOHEXOL 350 MG/ML SOLN
75.0000 mL | Freq: Once | INTRAVENOUS | Status: AC | PRN
  Administered 2022-05-14: 75 mL via INTRAVENOUS

## 2022-05-14 MED ORDER — DOXYCYCLINE HYCLATE 100 MG PO CAPS: 100.0000 mg | ORAL_CAPSULE | Freq: Two times a day (BID) | ORAL | 0 refills | Status: DC

## 2022-05-14 MED ORDER — KETOROLAC TROMETHAMINE 15 MG/ML IJ SOLN
15.0000 mg | Freq: Once | INTRAMUSCULAR | Status: AC
  Administered 2022-05-14: 15 mg via INTRAVENOUS
  Filled 2022-05-14: qty 1

## 2022-05-14 NOTE — ED Provider Triage Note (Signed)
Emergency Medicine Provider Triage Evaluation Note  Mali A Hutt , a 27 y.o. male  was evaluated in triage.  Pt complains of sharp stabbing chest pain since 6 PM last night.  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  Patient denies any known wound infections.  He last used this morning.  He denies any fever, chills.  He reports chest pain is worse with deep breathing, as well as exertion.  Review of Systems  Positive: Chest pain, shortness of breath Negative: Fever, chills  Physical Exam  BP 119/79   Pulse (!) 103   Temp 98.5 F (36.9 C) (Oral)   Resp 11   SpO2 100%  Gen:   Awake, no distress   Resp:  Normal effort  MSK:   Moves extremities without difficulty  Other:  Patient with track marks bilaterally, some bruising but no evidence of clear cellulitis or abscess.  He is tachycardic with normal rhythm.  No clear heart murmur auscultated.  Medical Decision Making  Medically screening exam initiated at 3:45 PM.  Appropriate orders placed.  Mali A Lyden was informed that the remainder of the evaluation will be completed by another provider, this initial triage assessment does not replace that evaluation, and the importance of remaining in the ED until their evaluation is complete.  Workup initiated in triage    Anselmo Pickler, [place] 05/14/22 1547

## 2022-05-14 NOTE — ED Provider Notes (Signed)
[facility] Provider Note   CSN: [id number] Arrival date & time: 05/14/22  1508     History  Chief Complaint  Patient presents with   Chest Pain    Thomas Walter is a 27 y.o. male.  HPI 27 year old male with a history of IV drug use presents with chest pain.  Chest pain started last night around 6 PM.  Patient states that he took a nap after this for started but then when he injected again he started having the pain again.  The pain has not gone away since though it is a little milder since arrival to the ER.  He denies fevers, cough, shortness of breath.  The pain is worse with deep inspiration.  No leg swelling or pain.  No current back pain.  He states that he most recently injected this morning.  He has never had anything like this before.  Patient also notes that he has a little bit of redness on his left forearm that he noticed this morning.  He states that he missed the vein and noticed instant pain but did not inject prior to that.  Home Medications Prior to Admission medications   Medication Sig Start Date End Date Taking? Authorizing Provider  doxycycline (VIBRAMYCIN) 100 MG capsule Take 1 capsule (100 mg total) by mouth 2 (two) times daily. 05/14/22  Yes Sherwood Gambler, MD  ibuprofen (ADVIL) 600 MG tablet Take 1 tablet (600 mg total) by mouth every 6 (six) hours as needed. 11/13/18   Melynda Ripple, MD  amphetamine-dextroamphetamine (ADDERALL XR) 20 MG 24 hr capsule Take 1 capsule (20 mg total) by mouth every morning. 01/01/13 11/13/18  Denita Lung, MD      Allergies    Amoxicillin, Amoxicillin [amoxicillin], and Amoxicillin-pot clavulanate    Review of Systems   Review of Systems  Constitutional:  Negative for fever.  Respiratory:  Negative for shortness of breath.   Cardiovascular:  Positive for chest pain. Negative for leg swelling.  Musculoskeletal:  Negative for back pain.  Skin:  Positive for color change.     Physical Exam Updated Vital Signs BP 128/80 (BP Location: Left Arm)   Pulse 92   Temp 98.6 F (37 C) (Oral)   Resp 20   SpO2 98%  Physical Exam Vitals and nursing note reviewed.  Constitutional:      Appearance: He is well-developed.  HENT:     Head: Normocephalic and atraumatic.  Cardiovascular:     Rate and Rhythm: Regular rhythm. Tachycardia present.     Heart sounds: Normal heart sounds. No murmur heard.    Comments: HR low 100s Pulmonary:     Effort: Pulmonary effort is normal.     Breath sounds: Normal breath sounds.  Chest:     Chest wall: Tenderness (mild "soreness" over sternum) present.  Abdominal:     Palpations: Abdomen is soft.     Tenderness: There is no abdominal tenderness.  Musculoskeletal:     Right lower leg: No edema.     Left lower leg: No edema.  Skin:    General: Skin is warm and dry.     Comments: Left proximal forearm has an area of light erythema. No significant tenderness. No fluctuance.  Neurological:     Mental Status: He is alert.     ED Results / Procedures / Treatments   Labs (all labs ordered are listed, but only abnormal results are displayed) Labs Reviewed  D-DIMER, QUANTITATIVE -  Abnormal; Notable for the following components:      Result Value   D-Dimer, Quant 0.75 (*)    All other components within normal limits  CULTURE, BLOOD (ROUTINE X 2)  CULTURE, BLOOD (ROUTINE X 2)  BASIC METABOLIC PANEL  CBC  LACTIC ACID, PLASMA  TROPONIN I (HIGH SENSITIVITY)  TROPONIN I (HIGH SENSITIVITY)    EKG EKG Interpretation  Date/Time:  Monday May 14 2022 15:37:54 EDT Ventricular Rate:  110 PR Interval:  127 QRS Duration: 92 QT Interval:  328 QTC Calculation: 444 R Axis:   74 Text Interpretation: Sinus tachycardia Since prior EKG, rate has increased Confirmed by Regan Lemming ([id number]) on 05/14/2022 3:59:45 PM  Radiology CT Angio Chest PE W and/or Wo Contrast  Result Date: 05/14/2022 CLINICAL DATA:  Positive D-dimer EXAM: CT  ANGIOGRAPHY CHEST WITH CONTRAST TECHNIQUE: Multidetector CT imaging of the chest was performed using the standard protocol during bolus administration of intravenous contrast. Multiplanar CT image reconstructions and MIPs were obtained to evaluate the vascular anatomy. RADIATION DOSE REDUCTION: This exam was performed according to the departmental dose-optimization program which includes automated exposure control, adjustment of the mA and/or kV according to patient size and/or use of iterative reconstruction technique. CONTRAST:  66mL OMNIPAQUE IOHEXOL 350 MG/ML SOLN COMPARISON:  None Available. FINDINGS: Cardiovascular: No evidence of pulmonary embolus. Normal heart size. No pericardial effusion. Normal caliber thoracic aorta with no atherosclerotic disease. Mediastinum/Nodes: Esophagus and thyroid are unremarkable. No enlarged lymph nodes seen in the chest. Lungs/Pleura: Central airways are patent. No consolidation, pleural effusion or pneumothorax. Upper Abdomen: No acute abnormality. Musculoskeletal: No chest wall abnormality. No acute or significant osseous findings. Review of the MIP images confirms the above findings. IMPRESSION: No evidence of pulmonary embolus or acute airspace opacity. Electronically Signed   By: Yetta Glassman M.D.   On: 05/14/2022 20:49   DG Chest 2 View  Result Date: 05/14/2022 CLINICAL DATA:  Chest pain. EXAM: CHEST - 2 VIEW COMPARISON:  April 20, 2022. FINDINGS: The heart size and mediastinal contours are within normal limits. Both lungs are clear. The visualized skeletal structures are unremarkable. IMPRESSION: No active cardiopulmonary disease. Electronically Signed   By: Marijo Conception M.D.   On: 05/14/2022 17:01    Procedures Procedures    Medications Ordered in ED Medications  sodium chloride 0.9 % bolus 1,000 mL (0 mLs Intravenous Stopped 05/14/22 1929)  ketorolac (TORADOL) 15 MG/ML injection 15 mg (15 mg Intravenous Given 05/14/22 1824)  iohexol (OMNIPAQUE) 350  MG/ML injection 75 mL (75 mLs Intravenous Contrast Given 05/14/22 2025)    ED Course/ Medical Decision Making/ A&P                             Medical Decision Making Amount and/or Complexity of Data Reviewed Labs: ordered.    Details: Normal troponin x 2.  Normal WBC.  Mildly elevated D-dimer. Radiology: ordered and independent interpretation performed.    Details: No pneumonia.  CTA without PE. ECG/medicine tests: independent interpretation performed.    Details: Tachycardia but no acute ischemia  Risk Prescription drug management.   Patient presents with chest pain that seems pleuritic.  Workup for PE obtained but this is negative.  When his heart rate had slowed down after treatment with Toradol and fluids, I reexamined him and continue to not hear a murmur.  There are no other overt signs that this is endocarditis though he does have this risk factor with IV  drug abuse.  We discussed this but at this point he wants to go home.  I think this is reasonable, will discharge home with return precautions.  Blood cultures have been obtained in triage.  Of note he has a little bit of erythema to his left forearm where he has previously injected.  No fluctuance.  It seems a little better than when I first saw him, will still cover with antibiotics.  Doubt deep space neck infection.  Will discharge home with return precautions.        Final Clinical Impression(s) / ED Diagnoses Final diagnoses:  Nonspecific chest pain  Left arm cellulitis    Rx / DC Orders ED Discharge Orders          Ordered    doxycycline (VIBRAMYCIN) 100 MG capsule  2 times daily        05/14/22 2145              Sherwood Gambler, MD 05/14/22 2249

## 2022-05-14 NOTE — Discharge Instructions (Addendum)
If you develop recurrent, continued, or worsening chest pain, shortness of breath, fever, vomiting, abdominal or back pain, or any other new/concerning symptoms then return to the ER for evaluation.  

## 2022-05-14 NOTE — ED Triage Notes (Signed)
C/o palpitations and centralized stabbing chest pain that has worsened since yesterday with sob. Pt reports IV heroin usage when cp started. [facility]en at [facility] this am. Given GI cocktail at [facility] w/o relief.

## 2022-05-19 LAB — CULTURE, BLOOD (ROUTINE X 2)
Culture: NO GROWTH
Culture: NO GROWTH
Special Requests: ADEQUATE
Special Requests: ADEQUATE

## 2022-06-24 ENCOUNTER — Telehealth: Payer: 59 | Admitting: Nurse Practitioner

## 2022-10-17 IMAGING — DX DG HUMERUS 2V *L*
2 series · 2 of 2 positions shown · non-contrast
Comparison: None.

CLINICAL DATA: Broken needle in AC.

EXAM:
LEFT HUMERUS - 2+ VIEW

[humerus ap]
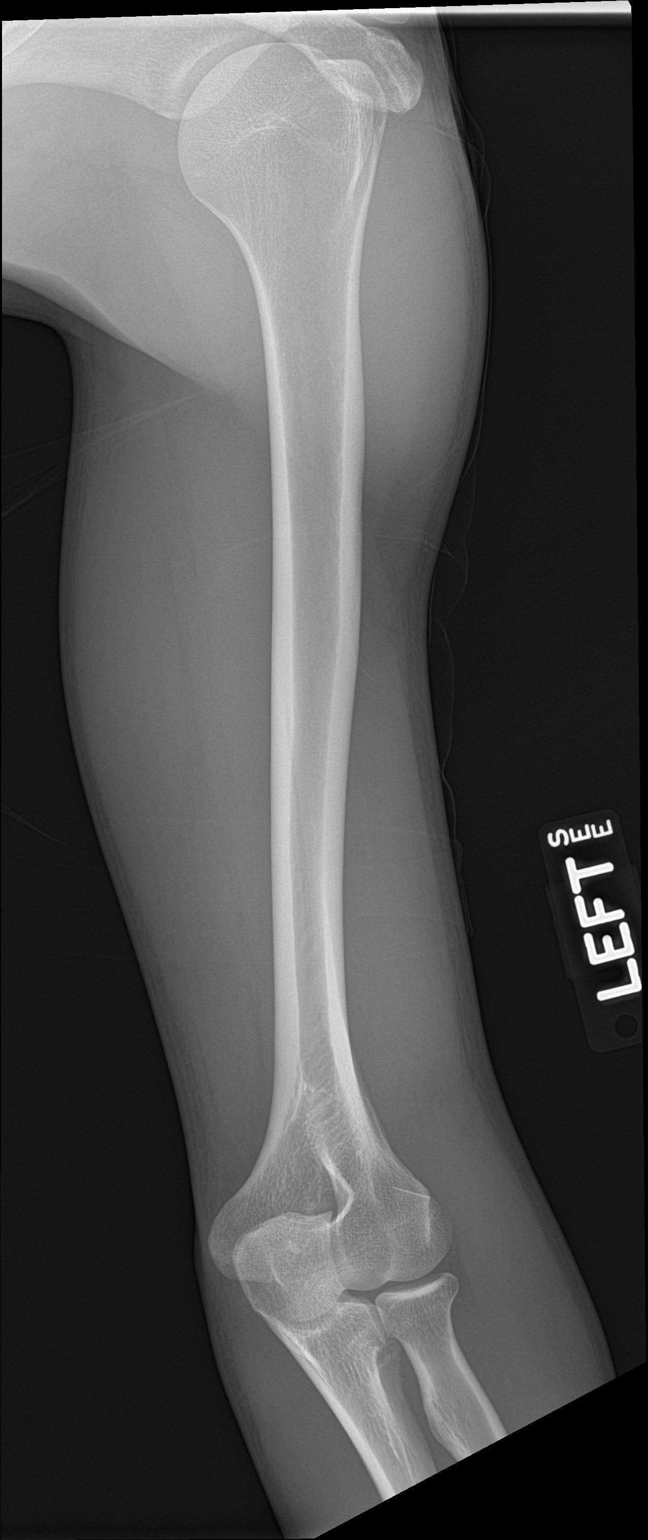

[humerus lat]
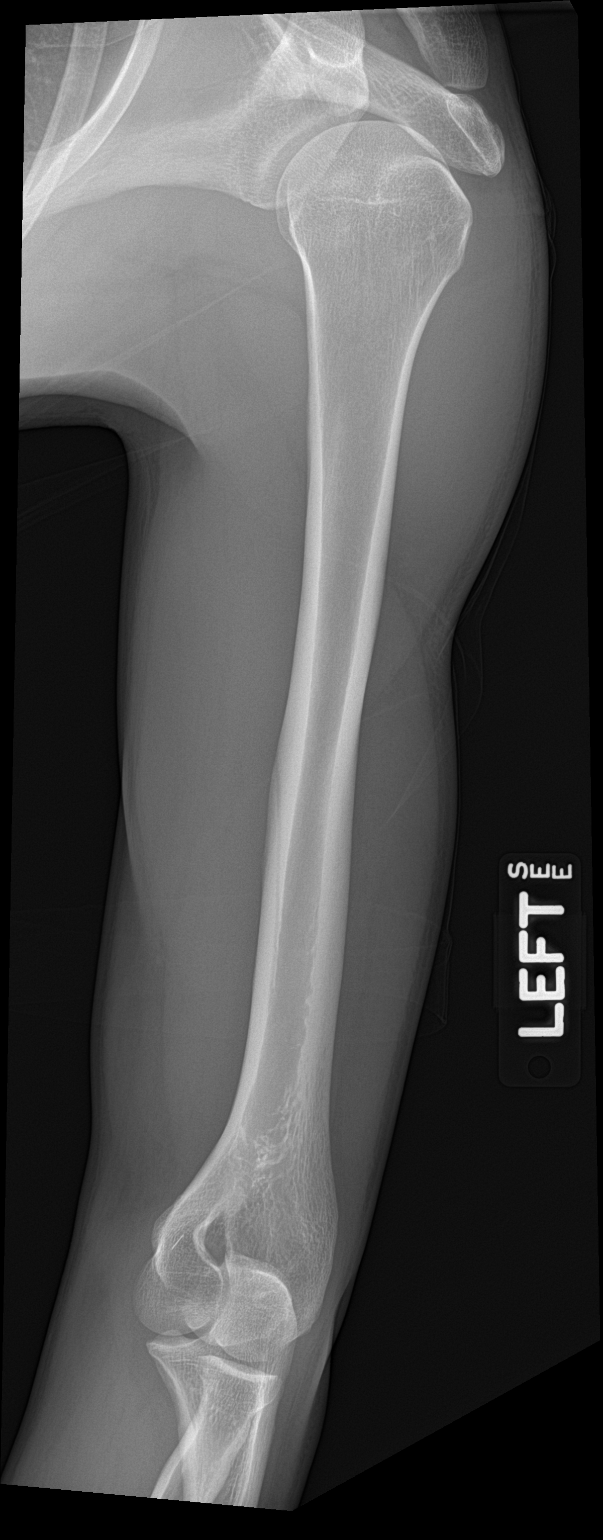

[2 of 2 positions shown; findings below may reference images not displayed]

FINDINGS: There is no evidence of fracture or other focal bone lesions. A
thin, linear 1.0 cm radiopaque foreign body is seen within the soft
tissues of the left antecubital fossa.
IMPRESSION: Thin needle fragment within the soft tissues of the left antecubital
fossa.

## 2023-02-14 ENCOUNTER — Emergency Department (HOSPITAL_COMMUNITY): Payer: 59

## 2023-02-14 ENCOUNTER — Other Ambulatory Visit: Payer: Self-pay

## 2023-02-14 ENCOUNTER — Encounter (HOSPITAL_COMMUNITY): Payer: Self-pay | Admitting: Emergency Medicine

## 2023-02-14 ENCOUNTER — Inpatient Hospital Stay (HOSPITAL_COMMUNITY)
Admission: EM | Admit: 2023-02-14 | Discharge: 2023-02-19 | DRG: 982 | Discharge status: Against Medical Advice | Payer: 59 | Attending: Internal Medicine | Admitting: Internal Medicine

## 2023-02-14 DIAGNOSIS — R451 Restlessness and agitation: Secondary | ICD-10-CM | POA: Diagnosis not present

## 2023-02-14 DIAGNOSIS — D649 Anemia, unspecified: Secondary | ICD-10-CM | POA: Diagnosis present

## 2023-02-14 DIAGNOSIS — L02413 Cutaneous abscess of right upper limb: Principal | ICD-10-CM | POA: Diagnosis present

## 2023-02-14 DIAGNOSIS — F909 Attention-deficit hyperactivity disorder, unspecified type: Secondary | ICD-10-CM | POA: Diagnosis not present

## 2023-02-14 DIAGNOSIS — E871 Hypo-osmolality and hyponatremia: Secondary | ICD-10-CM | POA: Diagnosis present

## 2023-02-14 DIAGNOSIS — R600 Localized edema: Secondary | ICD-10-CM | POA: Diagnosis not present

## 2023-02-14 DIAGNOSIS — L02411 Cutaneous abscess of right axilla: Secondary | ICD-10-CM | POA: Diagnosis not present

## 2023-02-14 DIAGNOSIS — B950 Streptococcus, group A, as the cause of diseases classified elsewhere: Secondary | ICD-10-CM | POA: Diagnosis present

## 2023-02-14 DIAGNOSIS — R7881 Bacteremia: Secondary | ICD-10-CM | POA: Diagnosis present

## 2023-02-14 DIAGNOSIS — F1110 Opioid abuse, uncomplicated: Secondary | ICD-10-CM | POA: Diagnosis present

## 2023-02-14 DIAGNOSIS — F1990 Other psychoactive substance use, unspecified, uncomplicated: Secondary | ICD-10-CM | POA: Diagnosis present

## 2023-02-14 DIAGNOSIS — R531 Weakness: Secondary | ICD-10-CM | POA: Diagnosis not present

## 2023-02-14 DIAGNOSIS — L03113 Cellulitis of right upper limb: Secondary | ICD-10-CM | POA: Diagnosis not present

## 2023-02-14 DIAGNOSIS — Z7151 Drug abuse counseling and surveillance of drug abuser: Secondary | ICD-10-CM

## 2023-02-14 DIAGNOSIS — Z881 Allergy status to other antibiotic agents status: Secondary | ICD-10-CM

## 2023-02-14 DIAGNOSIS — Z79899 Other long term (current) drug therapy: Secondary | ICD-10-CM

## 2023-02-14 DIAGNOSIS — M7989 Other specified soft tissue disorders: Secondary | ICD-10-CM | POA: Diagnosis not present

## 2023-02-14 DIAGNOSIS — L03119 Cellulitis of unspecified part of limb: Principal | ICD-10-CM

## 2023-02-14 DIAGNOSIS — L03114 Cellulitis of left upper limb: Secondary | ICD-10-CM | POA: Diagnosis present

## 2023-02-14 DIAGNOSIS — Z880 Allergy status to penicillin: Secondary | ICD-10-CM

## 2023-02-14 DIAGNOSIS — F17290 Nicotine dependence, other tobacco product, uncomplicated: Secondary | ICD-10-CM | POA: Diagnosis present

## 2023-02-14 LAB — HEPATIC FUNCTION PANEL
ALT: 33 U/L (ref 0–44)
AST: 22 U/L (ref 15–41)
Albumin: 3.5 g/dL (ref 3.5–5.0)
Alkaline Phosphatase: 75 U/L (ref 38–126)
Bilirubin, Direct: 0.1 mg/dL (ref 0.0–0.2)
Indirect Bilirubin: 0.3 mg/dL (ref 0.3–0.9)
Total Bilirubin: 0.4 mg/dL (ref <1.2)
Total Protein: 7.2 g/dL (ref 6.5–8.1)

## 2023-02-14 LAB — CBC WITH DIFFERENTIAL/PLATELET
Abs Immature Granulocytes: 0.04 10*3/uL (ref 0.00–0.07)
Basophils Absolute: 0 10*3/uL (ref 0.0–0.1)
Basophils Relative: 0 %
Eosinophils Absolute: 0.1 10*3/uL (ref 0.0–0.5)
Eosinophils Relative: 0 %
HCT: 33.4 % — ABNORMAL LOW (ref 39.0–52.0)
Hemoglobin: 11.4 g/dL — ABNORMAL LOW (ref 13.0–17.0)
Immature Granulocytes: 0 %
Lymphocytes Relative: 11 %
Lymphs Abs: 1.4 10*3/uL (ref 0.7–4.0)
MCH: 30.5 pg (ref 26.0–34.0)
MCHC: 34.1 g/dL (ref 30.0–36.0)
MCV: 89.3 fL (ref 80.0–100.0)
Monocytes Absolute: 1 10*3/uL (ref 0.1–1.0)
Monocytes Relative: 8 %
Neutro Abs: 9.7 10*3/uL — ABNORMAL HIGH (ref 1.7–7.7)
Neutrophils Relative %: 81 %
Platelets: 225 10*3/uL (ref 150–400)
RBC: 3.74 MIL/uL — ABNORMAL LOW (ref 4.22–5.81)
RDW: 13 % (ref 11.5–15.5)
WBC: 12.1 10*3/uL — ABNORMAL HIGH (ref 4.0–10.5)
nRBC: 0 % (ref 0.0–0.2)

## 2023-02-14 LAB — BASIC METABOLIC PANEL
Anion gap: 5 (ref 5–15)
BUN: 14 mg/dL (ref 6–20)
CO2: 26 mmol/L (ref 22–32)
Calcium: 8.5 mg/dL — ABNORMAL LOW (ref 8.9–10.3)
Chloride: 101 mmol/L (ref 98–111)
Creatinine, Ser: 0.78 mg/dL (ref 0.61–1.24)
GFR, Estimated: >60 mL/min (ref >60)
Glucose, Bld: 112 mg/dL — ABNORMAL HIGH (ref 70–99)
Potassium: 4 mmol/L (ref 3.5–5.1)
Sodium: 132 mmol/L — ABNORMAL LOW (ref 135–145)

## 2023-02-14 LAB — MAGNESIUM: Magnesium: 2.3 mg/dL (ref 1.7–2.4)

## 2023-02-14 LAB — PHOSPHORUS: Phosphorus: 3.5 mg/dL (ref 2.5–4.6)

## 2023-02-14 LAB — LACTIC ACID, PLASMA: Lactic Acid, Venous: 0.7 mmol/L (ref 0.5–1.9)

## 2023-02-14 MED ORDER — ENOXAPARIN SODIUM 40 MG/0.4ML IJ SOSY
40.0000 mg | PREFILLED_SYRINGE | INTRAMUSCULAR | Status: DC
  Administered 2023-02-14 – 2023-02-18 (×5): 40 mg via SUBCUTANEOUS
  Filled 2023-02-14 (×5): qty 0.4

## 2023-02-14 MED ORDER — HYDROMORPHONE HCL 1 MG/ML IJ SOLN
1.0000 mg | Freq: Once | INTRAMUSCULAR | Status: AC
  Administered 2023-02-14: 1 mg via INTRAVENOUS
  Filled 2023-02-14: qty 1

## 2023-02-14 MED ORDER — ENSURE ENLIVE PO LIQD: 237.0000 mL | Freq: Two times a day (BID) | ORAL | Status: DC

## 2023-02-14 MED ORDER — ONDANSETRON HCL 4 MG/2ML IJ SOLN: 4.0000 mg | Freq: Four times a day (QID) | INTRAMUSCULAR | Status: DC | PRN

## 2023-02-14 MED ORDER — ACETAMINOPHEN 650 MG RE SUPP: 650.0000 mg | Freq: Four times a day (QID) | RECTAL | Status: DC | PRN

## 2023-02-14 MED ORDER — LACTATED RINGERS IV BOLUS
1000.0000 mL | Freq: Once | INTRAVENOUS | Status: AC
  Administered 2023-02-14: 1000 mL via INTRAVENOUS

## 2023-02-14 MED ORDER — BUPRENORPHINE HCL-NALOXONE HCL 8-2 MG SL SUBL
1.0000 | SUBLINGUAL_TABLET | Freq: Two times a day (BID) | SUBLINGUAL | Status: DC
  Filled 2023-02-14: qty 1

## 2023-02-14 MED ORDER — ENSURE ENLIVE PO LIQD
237.0000 mL | Freq: Two times a day (BID) | ORAL | Status: DC
  Administered 2023-02-14 – 2023-02-19 (×6): 237 mL via ORAL

## 2023-02-14 MED ORDER — KETOROLAC TROMETHAMINE 30 MG/ML IJ SOLN
30.0000 mg | Freq: Four times a day (QID) | INTRAMUSCULAR | Status: AC
  Administered 2023-02-14 (×4): 30 mg via INTRAVENOUS
  Filled 2023-02-14 (×4): qty 1

## 2023-02-14 MED ORDER — KETOROLAC TROMETHAMINE 30 MG/ML IJ SOLN: 30.0000 mg | Freq: Four times a day (QID) | INTRAMUSCULAR | Status: DC | PRN

## 2023-02-14 MED ORDER — ONDANSETRON HCL 4 MG PO TABS: 4.0000 mg | ORAL_TABLET | Freq: Four times a day (QID) | ORAL | Status: DC | PRN

## 2023-02-14 MED ORDER — VANCOMYCIN HCL IN DEXTROSE 1-5 GM/200ML-% IV SOLN
1000.0000 mg | Freq: Three times a day (TID) | INTRAVENOUS | Status: DC
  Administered 2023-02-14 – 2023-02-15 (×3): 1000 mg via INTRAVENOUS
  Filled 2023-02-14 (×4): qty 200

## 2023-02-14 MED ORDER — BUPRENORPHINE HCL-NALOXONE HCL 2-0.5 MG SL SUBL: 1.0000 | SUBLINGUAL_TABLET | SUBLINGUAL | Status: AC | PRN

## 2023-02-14 MED ORDER — VANCOMYCIN HCL IN DEXTROSE 1-5 GM/200ML-% IV SOLN
1000.0000 mg | Freq: Once | INTRAVENOUS | Status: AC
  Administered 2023-02-14: 1000 mg via INTRAVENOUS
  Filled 2023-02-14: qty 200

## 2023-02-14 MED ORDER — ACETAMINOPHEN 325 MG PO TABS
650.0000 mg | ORAL_TABLET | Freq: Four times a day (QID) | ORAL | Status: DC | PRN
  Administered 2023-02-14 (×2): 650 mg via ORAL
  Filled 2023-02-14 (×2): qty 2

## 2023-02-14 MED ORDER — TRAMADOL HCL 50 MG PO TABS
50.0000 mg | ORAL_TABLET | Freq: Four times a day (QID) | ORAL | Status: DC | PRN
  Administered 2023-02-14 – 2023-02-19 (×6): 50 mg via ORAL
  Filled 2023-02-14 (×6): qty 1

## 2023-02-14 MED ORDER — KETOROLAC TROMETHAMINE 15 MG/ML IJ SOLN
15.0000 mg | Freq: Once | INTRAMUSCULAR | Status: AC
  Administered 2023-02-14: 15 mg via INTRAVENOUS
  Filled 2023-02-14: qty 1

## 2023-02-14 MED ORDER — ONDANSETRON HCL 4 MG/2ML IJ SOLN
4.0000 mg | Freq: Once | INTRAMUSCULAR | Status: AC
  Administered 2023-02-14: 4 mg via INTRAVENOUS
  Filled 2023-02-14: qty 2

## 2023-02-14 NOTE — ED Notes (Signed)
This RN notified Humes, PA that second set of cultures have not been obtained yet d/t pt being a hard stick. IV consult placed and Humes, PA stated she would like to go ahead and start the IV abx.

## 2023-02-14 NOTE — Progress Notes (Signed)
0930 Went through the patient belongings. 1 black small bookbag, gum, used syringe no needle, 2 pens, two colored pens, a penny, keys.

## 2023-02-14 NOTE — H&P (Addendum)
History and Physical    Patient: Thomas Walter ZOX:[id number] DOB: [birth year] DOA: 02/14/2023 DOS: the patient was seen and examined on 02/14/2023 PCP: Ronnald Nian, MD  Patient coming from: Home  Chief Complaint:  Chief Complaint  Patient presents with   Arm Pain   HPI: Thomas Walter is a 27 y.o. male with medical history significant of ADD, left varicocele only, IV heroin abuse who is coming to the emergency department with a 3-day history of progressively worse LUE erythema, edema, calor and tenderness.  He has been injecting about a gram of heroin daily.  He has tried to detox in the past twice.  Sometimes he has felt depressed, but denied SI or HI.  No changes in appetite, but feels like he has lost some weight in the past couple weeks. He has felt febrile, has had chills, but denied rhinorrhea, sore throat, wheezing or hemoptysis.  No chest pain, palpitations, diaphoresis, PND, orthopnea or pitting edema of the lower extremities.  No abdominal pain, emesis, diarrhea, constipation, melena or hematochezia.  No flank pain, dysuria, frequency or hematuria.  No polyuria, polydipsia, polyphagia or blurred vision.   Lab work: CBC showed white count 12.1, hemoglobin 11.4 g/dL platelets 811.  Lactic acid, LFTs, magnesium, phosphorus and renal function were normal.  Sodium 132 mmol/L and glucose 112 mg/deciliter.  The rest of the electrolytes are normal after calcium correction.  Blood cultures x 2 have been drawn.  Imaging: Left hand, left wrist and left forearm x-ray showing soft tissue swelling, but no fractures or foreign bodies.   ED course: Initial vital signs were temperature 100 F, pulse 95 respiration 18, BP 133/77 mmHg O2 sat 100% on room air.  The patient received 1 mg of hydromorphone, ketorolac 15 mg IVP, 1000 mL of LR bolus, ondansetron 4 mg IVP and was started on vancomycin per pharmacy dosing.  Review of Systems: As mentioned in the history of present illness. All other systems  reviewed and are negative. Past Medical History:  Diagnosis Date   ADD (attention deficit disorder with hyperactivity)    Left varicocele    Past Surgical History:  Procedure Laterality Date   MYRINGOTOMY     CROSSLEY   Social History:  reports that he has never smoked. He has never used smokeless tobacco. He reports current drug use. Drug: IV. He reports that he does not drink alcohol.  Allergies  Allergen Reactions   Amoxicillin    Amoxicillin [Amoxicillin]    Amoxicillin-Pot Clavulanate     History reviewed. No pertinent family history.  Prior to Admission medications   Medication Sig Start Date End Date Taking? Authorizing Provider  amphetamine-dextroamphetamine (ADDERALL XR) 20 MG 24 hr capsule Take 1 capsule (20 mg total) by mouth every morning. 01/01/13 11/13/18  Ronnald Nian, MD    Physical Exam: Vitals:   02/14/23 0600 02/14/23 0605 02/14/23 0615 02/14/23 0630  BP: 123/73  122/69 109/69  Pulse: (!) 105 71 79 73  Resp: 14     Temp:      TempSrc:      SpO2: 98% 97% 98% 97%  Weight:      Height:       Physical Exam Vitals and nursing note reviewed.  Constitutional:      General: He is awake. He is not in acute distress.    Appearance: He is normal weight. He is ill-appearing.  HENT:     Head: Normocephalic.     Nose: No rhinorrhea.  Mouth/Throat:     Mouth: Mucous membranes are moist.  Eyes:     General: No scleral icterus.    Pupils: Pupils are equal, round, and reactive to light.  Neck:     Vascular: No JVD.  Cardiovascular:     Rate and Rhythm: Normal rate and regular rhythm.     Heart sounds: S1 normal and S2 normal.  Pulmonary:     Effort: Pulmonary effort is normal.     Breath sounds: Normal breath sounds. No wheezing, rhonchi or rales.  Abdominal:     General: Bowel sounds are normal. There is no distension.     Palpations: Abdomen is soft.     Tenderness: There is no abdominal tenderness.  Musculoskeletal:     Cervical back: Neck  supple.     Right lower leg: No edema.     Left lower leg: No edema.  Skin:    General: Skin is warm and dry.     Findings: Erythema and wound present.  Neurological:     Mental Status: He is alert and oriented to person, place, and time. Mental status is at baseline.  Psychiatric:        Mood and Affect: Mood normal.        Behavior: Behavior normal. Behavior is cooperative.        Data Reviewed:  Results are pending, will review when available.  Assessment and Plan: Principal Problem:   Cellulitis of left upper extremity Admit to MedSurg/inpatient. Continue IV fluids. Continue vancomycin per pharmacy. Follow-up blood culture and sensitivity Follow CBC and CMP in a.m.  Active Problems:   Heroin abuse (HCC)   IVDU (intravenous drug user) Started on Suboxone protocol. Consult TOC team. Briefly advised on IVDA risks like: Bacteremia, endocarditis, risk of valvular disease, septic emboli, etc.    Hyponatremia In the setting of acute illness. Mild/Unknown significance. Follow up sodium level.  Weight loss second 1 hour a ago so diarrhea logy with flea    Normocytic anemia In the setting of acute illness/chronic IVDA. Monitor Hematocrit and hemoglobin.     Advance Care Planning:   Code Status: Full Code   Consults:   Family Communication:   Severity of Illness: The appropriate patient status for this patient is INPATIENT. Inpatient status is judged to be reasonable and necessary in order to provide the required intensity of service to ensure the patient's safety. The patient's presenting symptoms, physical exam findings, and initial radiographic and laboratory data in the context of their chronic comorbidities is felt to place them at high risk for further clinical deterioration. Furthermore, it is not anticipated that the patient will be medically stable for discharge from the hospital within 2 midnights of admission.   * I certify that at the point of admission  it is my clinical judgment that the patient will require inpatient hospital care spanning beyond 2 midnights from the point of admission due to high intensity of service, high risk for further deterioration and high frequency of surveillance required.*  Author: Bobette Mo, MD 02/14/2023 7:57 AM  For on call review [email or web address].   This document was prepared using Dragon voice recognition software and may contain some unintended transcription errors.

## 2023-02-14 NOTE — ED Provider Notes (Signed)
[place] EMERGENCY DEPARTMENT AT [facility] Provider Note   CSN: [id number] Arrival date & time: 02/14/23  1027     History  Chief Complaint  Patient presents with   Arm Pain    Thomas Walter is a 27 y.o. male.  27 y/o male with hx of ADD and IVDU presents for pain to b/l upper extremities. Has been having most pain to the right proximal forearm and elbow as well as the left hand and wrist. Symptoms worsening x 3 days with associated erythema, heat to touch. No known fevers or hx of MRSA. Rates his pain at 10/10. Last heroin use just PTA  The history is provided by the patient. No language interpreter was used.  Arm Pain       Home Medications Prior to Admission medications   Medication Sig Start Date End Date Taking? Authorizing Provider  doxycycline (VIBRAMYCIN) 100 MG capsule Take 1 capsule (100 mg total) by mouth 2 (two) times daily. 05/14/22   Pricilla Loveless, MD  ibuprofen (ADVIL) 600 MG tablet Take 1 tablet (600 mg total) by mouth every 6 (six) hours as needed. 11/13/18   Domenick Gong, MD  amphetamine-dextroamphetamine (ADDERALL XR) 20 MG 24 hr capsule Take 1 capsule (20 mg total) by mouth every morning. 01/01/13 11/13/18  Ronnald Nian, MD      Allergies    Amoxicillin, Amoxicillin [amoxicillin], and Amoxicillin-pot clavulanate    Review of Systems   Review of Systems Ten systems reviewed and are negative for acute change, except as noted in the HPI.    Physical Exam Updated Vital Signs BP 114/63 (BP Location: Left Arm)   Pulse 99   Temp 98.9 F (37.2 C) (Oral)   Resp 16   Ht 5\' 11"  (1.803 m)   Wt 68 kg   SpO2 97%   BMI 20.91 kg/m   Physical Exam Vitals and nursing note reviewed.  Constitutional:      General: He is not in acute distress.    Appearance: He is well-developed. He is not diaphoretic.     Comments: Nontoxic. Appears uncomfortable.  HENT:     Head: Normocephalic and atraumatic.  Eyes:     General: No scleral  icterus.    Conjunctiva/sclera: Conjunctivae normal.  Cardiovascular:     Rate and Rhythm: Regular rhythm. Tachycardia present.     Pulses: Normal pulses.  Pulmonary:     Effort: Pulmonary effort is normal. No respiratory distress.     Comments: Respirations even and unlabored Musculoskeletal:        General: Normal range of motion.     Cervical back: Normal range of motion.     Comments: Blanching erythema of the medial proximal right forearm and elbow with preserved PROM. No crepitus. Blanching erythema and warmth to the left wrist and hand which extends to the forearm. Also no crepitus. Exquisite TTP with soft compartments. Capillary refill intact b/l.   Skin:    General: Skin is warm and dry.     Coloration: Skin is not pale.     Findings: Erythema present. No rash.     Comments: Track marks to BUE  Neurological:     Mental Status: He is alert and oriented to person, place, and time.     Coordination: Coordination normal.  Psychiatric:        Mood and Affect: Mood is anxious.        Speech: Speech is rapid and pressured.  Behavior: Behavior is agitated.         ED Results / Procedures / Treatments   Labs (all labs ordered are listed, but only abnormal results are displayed) Labs Reviewed  BASIC METABOLIC PANEL - Abnormal; Notable for the following components:      Result Value   Sodium 132 (*)    Glucose, Bld 112 (*)    Calcium 8.5 (*)    All other components within normal limits  CBC WITH DIFFERENTIAL/PLATELET - Abnormal; Notable for the following components:   WBC 12.1 (*)    RBC 3.74 (*)    Hemoglobin 11.4 (*)    HCT 33.4 (*)    Neutro Abs 9.7 (*)    All other components within normal limits  CULTURE, BLOOD (ROUTINE X 2)  CULTURE, BLOOD (ROUTINE X 2)  LACTIC ACID, PLASMA  CBC WITH DIFFERENTIAL/PLATELET    EKG None  Radiology DG Hand Complete Left Result Date: 02/14/2023 CLINICAL DATA:  Cellulitis left hand and wrist. EXAM: LEFT HAND - COMPLETE  3+ VIEW COMPARISON:  None Available. FINDINGS: Soft tissue swelling along the dorsum of the hand. No radiopaque foreign body. No acute bony abnormality. Specifically, no fracture, subluxation, or dislocation. Joint spaces maintained. IMPRESSION: Dorsal soft tissue swelling.  No acute bony abnormality. Electronically Signed   By: Charlett Nose M.D.   On: 02/14/2023 01:40   DG Wrist Complete Left Result Date: 02/14/2023 CLINICAL DATA:  Cellulitis left hand and wrist. EXAM: LEFT WRIST - COMPLETE 3+ VIEW COMPARISON:  None Available. FINDINGS: Soft tissue swelling along the dorsum of the hand. No acute bony abnormality. Specifically, no fracture, subluxation, or dislocation. No radiopaque foreign body. Joint spaces maintained. IMPRESSION: Dorsal soft tissue swelling.  No acute bony abnormality. Electronically Signed   By: Charlett Nose M.D.   On: 02/14/2023 01:39   DG Forearm Right Result Date: 02/14/2023 CLINICAL DATA:  Cellulitis right forearm EXAM: RIGHT FOREARM - 2 VIEW COMPARISON:  None Available. FINDINGS: There is no evidence of fracture or other focal bone lesions. Soft tissues are unremarkable. No radiopaque foreign body. IMPRESSION: Negative. Electronically Signed   By: Charlett Nose M.D.   On: 02/14/2023 01:39    Procedures .Critical Care  Performed by: Antony Madura, PA-C Authorized by: Antony Madura, PA-C   Critical care provider statement:    Critical care time (minutes):  30   Critical care time was exclusive of:  Separately billable procedures and treating other patients   Critical care was necessary to treat or prevent imminent or life-threatening deterioration of the following conditions:  Sepsis   Critical care was time spent personally by me on the following activities:  Development of treatment plan with patient or surrogate, discussions with consultants, evaluation of patient's response to treatment, examination of patient, ordering and review of laboratory studies, ordering and  review of radiographic studies, ordering and performing treatments and interventions, pulse oximetry, re-evaluation of patient's condition, review of old charts and obtaining history from patient or surrogate   I assumed direction of critical care for this patient from another provider in my specialty: no     Care discussed with: admitting provider       Medications Ordered in ED Medications  vancomycin (VANCOCIN) IVPB 1000 mg/200 mL premix (has no administration in time range)  lactated ringers bolus 1,000 mL (0 mLs Intravenous Stopped 02/14/23 0355)  ketorolac (TORADOL) 15 MG/ML injection 15 mg (15 mg Intravenous Given 02/14/23 0131)  vancomycin (VANCOCIN) IVPB 1000 mg/200 mL premix (0 mg  Intravenous Stopped 02/14/23 0355)  HYDROmorphone (DILAUDID) injection 1 mg (1 mg Intravenous Given 02/14/23 0400)  ondansetron (ZOFRAN) injection 4 mg (4 mg Intravenous Given 02/14/23 0400)    ED Course/ Medical Decision Making/ A&P Clinical Course as of 02/14/23 0613  Thu Feb 14, 2023  0358 Patient agitated, fidgeting in the bed, moaning. Girlfriend believes he may be "dope sick". Will give a dose of Dilaudid in attempt to improve pain and withdrawal symptoms. Currently, patient declining admission, but will reassess once symptoms better managed.  [KH]  [phone] Patient more calm, less agitated. HR improved to mid 70's. However, now sleepy and will not wake long enough to discuss disposition plan. Will continue to monitor. [KH]  0510 Continues to moan to sternal rubbing and loud voice. Will not yet consent to admission. [KH]  3086 MD Julian Reil aware of patient. Patient to be assessed by morning hospitalist team. [KH]    Clinical Course User Index [KH] Antony Madura, PA-C                                 Medical Decision Making Amount and/or Complexity of Data Reviewed Labs: ordered. Radiology: ordered.  Risk Prescription drug management. Decision regarding hospitalization.   This patient presents  to the ED for concern of LUE and RUE pain and swelling, this involves an extensive number of treatment options, and is a complaint that carries with it a high risk of complications and morbidity.  The differential diagnosis includes cellulitis vs abscess vs septic joint vs bacteremia   Co morbidities that complicate the patient evaluation  IVDU   Additional history obtained:  Additional history obtained from significant other, at bedside   Lab Tests:  I Ordered, and personally interpreted labs.  The pertinent results include:  WBC 12.1, Hgb 11.4. Lactic acid normal. Blood cultures pending.   Imaging Studies ordered:  I ordered imaging studies including Xray L hand, L wrist, R forearm  I independently visualized and interpreted imaging which showed dorsal soft tissue swelling of L wrist and hand. No subcutaneous emphysema on imaging of LUE or RUE. I agree with the radiologist interpretation   Cardiac Monitoring:  The patient was maintained on a cardiac monitor.  I personally viewed and interpreted the cardiac monitored which showed an underlying rhythm of: NSR   Medicines ordered and prescription drug management:  I ordered medication including Toradol and Dilaudid for pain, IV vancomycin for cellulitis.  Reevaluation of the patient after these medicines showed that the patient improved I have reviewed the patients home medicines and have made adjustments as needed   Test Considered:  UDS   Critical Interventions:  IV vancomycin   Consultations Obtained:  I requested consultation with the hospitalist and discussed lab and imaging findings as well as pertinent plan - they will assess patient in the ED for admission.   Problem List / ED Course:  As above   Reevaluation:  After the interventions noted above, I reevaluated the patient and found that they have : remained stable   Social Determinants of Health:  IVDU   Dispostion:  After consideration of  the diagnostic results and the patients response to treatment, I feel that the patent would benefit from admission for ongoing IV abx. Concern for sepsis based on arrival temp, leukocytosis, HR >90. Hospitalist to assess in the ED for admission. Clinically, no drainable fluid collection or abscess on exam to necessitate surgical intervention at this  time.         Final Clinical Impression(s) / ED Diagnoses Final diagnoses:  Cellulitis of upper extremity, unspecified laterality  IVDU (intravenous drug user)    Rx / DC Orders ED Discharge Orders     None         Antony Madura, PA-C 02/14/23 0618    Nira Conn, MD 02/14/23 [phone]

## 2023-02-14 NOTE — Progress Notes (Signed)
Girlfriend arrived to room

## 2023-02-14 NOTE — Progress Notes (Signed)
Pharmacy Antibiotic Note  Thomas Walter is a 27 y.o. male admitted on 02/14/2023 with r L sided arm pain that radiates to the wrist. Pt reports using heroin .  Pharmacy has been consulted for vancomycin dosing.  Plan: Vancomycin 1gm IV x 1 then 1gm q8h (AUC 523.3, Scr 0.8, TBW) Follow renal function, cultures and clinical coruse  Height: 5\' 11"  (180.3 cm) Weight: 68 kg (149 lb 14.6 oz) IBW/kg (Calculated) : 75.3  Temp (24hrs), Avg:100 F (37.8 C), Min:100 F (37.8 C), Max:100 F (37.8 C)  Recent Labs  Lab 02/14/23 0122  CREATININE 0.78  LATICACIDVEN 0.7    Estimated Creatinine Clearance: 135.8 mL/min (by C-G formula based on SCr of 0.78 mg/dL).    Allergies  Allergen Reactions   Amoxicillin    Amoxicillin [Amoxicillin]    Amoxicillin-Pot Clavulanate     Antimicrobials this admission: 12/26 vanc >>  Dose adjustments this admission:   Microbiology results: 12/26 BCx:   Thank you for allowing pharmacy to be a part of this patient's care.  Arley Phenix RPh 02/14/2023, 2:15 AM

## 2023-02-14 NOTE — Plan of Care (Signed)

## 2023-02-14 NOTE — ED Triage Notes (Signed)
PT BIB EMS from hotel for L sided arm pain that radiates to the wrist. Pt reports using heroin. Pt reports both arms hurt but pain is worse in L arm. Rates pain a 10

## 2023-02-15 ENCOUNTER — Inpatient Hospital Stay (HOSPITAL_COMMUNITY): Payer: 59

## 2023-02-15 DIAGNOSIS — L03114 Cellulitis of left upper limb: Secondary | ICD-10-CM | POA: Diagnosis not present

## 2023-02-15 DIAGNOSIS — R7881 Bacteremia: Secondary | ICD-10-CM

## 2023-02-15 LAB — BLOOD CULTURE ID PANEL (REFLEXED) - BCID2

## 2023-02-15 LAB — COMPREHENSIVE METABOLIC PANEL
ALT: 26 U/L (ref 0–44)
AST: 17 U/L (ref 15–41)
Albumin: 2.8 g/dL — ABNORMAL LOW (ref 3.5–5.0)
Alkaline Phosphatase: 70 U/L (ref 38–126)
Anion gap: 10 (ref 5–15)
BUN: 12 mg/dL (ref 6–20)
CO2: 23 mmol/L (ref 22–32)
Calcium: 8.3 mg/dL — ABNORMAL LOW (ref 8.9–10.3)
Chloride: 101 mmol/L (ref 98–111)
Creatinine, Ser: 0.63 mg/dL (ref 0.61–1.24)
GFR, Estimated: >60 mL/min (ref >60)
Glucose, Bld: 111 mg/dL — ABNORMAL HIGH (ref 70–99)
Potassium: 3.6 mmol/L (ref 3.5–5.1)
Sodium: 134 mmol/L — ABNORMAL LOW (ref 135–145)
Total Bilirubin: 0.3 mg/dL (ref <1.2)
Total Protein: 6 g/dL — ABNORMAL LOW (ref 6.5–8.1)

## 2023-02-15 LAB — CBC
HCT: 34.3 % — ABNORMAL LOW (ref 39.0–52.0)
Hemoglobin: 11.2 g/dL — ABNORMAL LOW (ref 13.0–17.0)
MCH: 29.9 pg (ref 26.0–34.0)
MCHC: 32.7 g/dL (ref 30.0–36.0)
MCV: 91.7 fL (ref 80.0–100.0)
Platelets: 191 10*3/uL (ref 150–400)
RBC: 3.74 MIL/uL — ABNORMAL LOW (ref 4.22–5.81)
RDW: 13.2 % (ref 11.5–15.5)
WBC: 10.5 10*3/uL (ref 4.0–10.5)
nRBC: 0 % (ref 0.0–0.2)

## 2023-02-15 LAB — ECHOCARDIOGRAM COMPLETE
AR max vel: 3.03 cm2
AV Area VTI: 3.09 cm2
AV Area mean vel: 3.02 cm2
AV Mean grad: 3 mm[Hg]
AV Peak grad: 6.6 mm[Hg]
Ao pk vel: 1.28 m/s
Area-P 1/2: 3.77 cm2
Height: 71 in
S' Lateral: 3.2 cm
Weight: 2398.6 [oz_av]

## 2023-02-15 LAB — HIV ANTIBODY (ROUTINE TESTING W REFLEX): HIV Screen 4th Generation wRfx: NONREACTIVE

## 2023-02-15 MED ORDER — NICOTINE 21 MG/24HR TD PT24
21.0000 mg | MEDICATED_PATCH | Freq: Every day | TRANSDERMAL | Status: DC
  Administered 2023-02-15 – 2023-02-19 (×5): 21 mg via TRANSDERMAL
  Filled 2023-02-15 (×5): qty 1

## 2023-02-15 MED ORDER — CEFAZOLIN SODIUM-DEXTROSE 2-4 GM/100ML-% IV SOLN
2.0000 g | Freq: Three times a day (TID) | INTRAVENOUS | Status: DC
  Administered 2023-02-15 – 2023-02-18 (×10): 2 g via INTRAVENOUS
  Filled 2023-02-15 (×9): qty 100

## 2023-02-15 MED ORDER — KETOROLAC TROMETHAMINE 30 MG/ML IJ SOLN
30.0000 mg | Freq: Four times a day (QID) | INTRAMUSCULAR | Status: DC
  Administered 2023-02-15 – 2023-02-16 (×4): 30 mg via INTRAVENOUS
  Filled 2023-02-15 (×4): qty 1

## 2023-02-15 MED ORDER — IOHEXOL 300 MG/ML  SOLN
100.0000 mL | Freq: Once | INTRAMUSCULAR | Status: AC | PRN
  Administered 2023-02-15: 100 mL via INTRAVENOUS

## 2023-02-15 MED ORDER — LINEZOLID 600 MG PO TABS
600.0000 mg | ORAL_TABLET | Freq: Two times a day (BID) | ORAL | Status: DC
  Administered 2023-02-15 – 2023-02-19 (×8): 600 mg via ORAL
  Filled 2023-02-15 (×11): qty 1

## 2023-02-15 NOTE — TOC Initial Note (Signed)
Transition of Care [facility]) - Initial/Assessment Note    Patient Details  Name: Thomas Walter MRN: [id number] Date of Birth: 1995-02-25  Transition of Care [facility]) CM/SW Contact:    Lanier Clam, RN Phone Number: 02/15/2023, 3:14 PM  Clinical Narrative:  Not able to confirm if homeless d/t patient getting medical w/u. Will provide resources on AVS, added substance use resources,needle exchange program resource.Continue to monitor.                 Expected Discharge Plan: Homeless Shelter Barriers to Discharge: Continued Medical Work up   Patient Goals and CMS Choice Patient states their goals for this hospitalization and ongoing recovery are:: Homeless shelter CMS Medicare.gov Compare Post Acute Care list provided to:: Patient        Expected Discharge Plan and Services   Discharge Planning Services: CM Consult   Living arrangements for the past 2 months: Homeless Shelter                                      Prior Living Arrangements/Services Living arrangements for the past 2 months: Homeless Shelter                     Activities of Daily Living   ADL Screening (condition at time of admission) Independently performs ADLs?: Yes (appropriate for developmental age) Is the patient deaf or have difficulty hearing?: No Does the patient have difficulty seeing, even when wearing glasses/contacts?: No Does the patient have difficulty concentrating, remembering, or making decisions?: No  Permission Sought/Granted                  Emotional Assessment              Admission diagnosis:  IVDU (intravenous drug user) [F19.90] Cellulitis of left upper extremity [L03.114] Cellulitis of upper extremity, unspecified laterality [L03.119] Patient Active Problem List   Diagnosis Date Noted   Cellulitis of left upper extremity 02/14/2023   Hyponatremia 02/14/2023   Normocytic anemia 02/14/2023   Heroin abuse (HCC) 05/14/2022   IVDU (intravenous drug user)  05/14/2022   Left varicocele 01/01/2013   ADD (attention deficit disorder) 06/29/2010   PCP:  Ronnald Nian, MD Pharmacy:   [facility] [phone] - [place], [place] - [address] AT [address] OF [address] [place] [address] Phone: [phone] Fax: [phone]  [id number] [place],  - [address] AT [facility] [address] [facility] [address] [place] [address] Phone: [phone] Fax: [phone]     Social Drivers of Health (SDOH) Social History: SDOH Screenings   Food Insecurity: No Food Insecurity (02/14/2023)  Housing: High Risk (02/14/2023)  Transportation Needs: No Transportation Needs (02/14/2023)  Utilities: At Risk (02/14/2023)  Tobacco Use: Low Risk  (02/14/2023)   SDOH Interventions:     Readmission Risk Interventions     No data to display

## 2023-02-15 NOTE — Progress Notes (Signed)
  Echocardiogram 2D Echocardiogram has been performed.  Reinaldo Raddle Pegge Cumberledge 02/15/2023, 2:14 PM

## 2023-02-15 NOTE — Progress Notes (Signed)
Patient consent to room search and personal belonging search. Lori,RN Equities trader as witness to the room search.

## 2023-02-15 NOTE — Progress Notes (Signed)
Late Entry: Patient was observed in the bathroom with girlfriend for approximately 35 minutes.  Patient returned to bed notable drowsy, slurring words, unable to keep eyes open but responding verbally. After girlfriend exited bathroom Noted with needles in the bathroom in a black bag. Dr. Lyndel Safe notified. Security noted, Database administrator Laurie,RN notified, 5 Kingsten Moreen Fowler -Facilities manager notified.  Right arm red and swollen near [facility]; this is a new finding-MD notified. Dr. Lyndel Safe came to bedside to assess. Security and house supervisor also came bedside to talk with the patient. Room search done by security. Security found used needles with some kind of liquid inside, medication narcan also found in bag. Needles deposed in sharps by security. Patient girlfriend escorted off unit by security. House supervisor Laurie,RN and Dr. Cathie Beams reviewed hospital policy with patient and need for ongoing treatment. Patient requested to leave AMA but then agreed to stay for treatment. Patient now has remote video sitter in room and place on vitals signs every four hours. Safety precautions maintained.  Vitals:   02/15/23 1134 02/15/23 1135  BP: 111/60   Pulse: 79 79  Resp:    Temp:  99 F (37.2 C)  SpO2: 97% 97%

## 2023-02-15 NOTE — Discharge Instructions (Signed)
Syringe Services Program  [facility] provides participants with free services, supplies, and support at mobile locations throughout [facility]. Click HERE to see our weekly schedule. Our outreach team is staffed by clinical social workers and peer support specialists who offer an array of community-based services, including:  Naloxone (Narcan)  Syringe service program ([facility]P)  Overdose prevention training  Linkage to substance use treatment  Healthcare referrals, including Hep C/HIV testing  For more information: Text or Call: [phone] Email: ssp@[facility] .org

## 2023-02-15 NOTE — Progress Notes (Signed)
PROGRESS NOTE    Thomas Walter  ZOX:[id number] DOB: 05-13-1995 DOA: 02/14/2023 PCP: Ronnald Nian, MD    Brief Narrative:  27 year old with history of ADD, IV heroin use presented with 3-day history of left upper extremity erythema, edema, pain as well as right elbow pain.  In the emergency room white cell count 12.1, hemoglobin 11.4.  X-ray showing soft tissue swelling.  No foreign body. Blood cultures growing Streptococcus.  Subjective: Patient seen and examined early morning with girlfriend at the bedside.  Patient complained of not feeling well but he will fall back asleep.  Discussed about blood culture reports, possible need for prolonged antibiotics, CT scans and echocardiogram.  Patient agreed to stay.  Later afternoon, patient was noted to be more somnolent and his girlfriend with possession  needles.  Patient was examined.  He is alert awake.  Drowsy.  Wanting to leave AMA.  Possible illicit drug use, no visitor allowed.  Geographical information systems officer. Patient wanted to leave, charge nurse, supervisor and this provider explained patient about bacteremia, need to complete treatment, risk of worsening infection, disability and death if untreated and right now he has agreeable to stay, however he is at risk of leaving AMA.   Assessment & Plan:   Streptococcal bacteremia: Diffuse soft tissue infection, cellulitis left dorsum of the hand, cellulitis right elbow secondary to heroin injection. -Blood cultures positive for Streptococcus.  On vancomycin.  Will change to cefazolin.  He had allergy to amoxicillin and Augmentin, unable to recall but not likely severe allergies. -Repeat blood cultures today.  2D echocardiogram. -CT scan with contrast left hand and right elbow to rule out any abscess or osteomyelitis. Will involve infectious disease team if he does not leave AMA today.  Heroin abuse: Ongoing active use of heroin.  Previously on Suboxone, resume.  Toradol for pain control. Suspected  using heroin in the hospital room. TeleSitter Search for paraphernalia, needles disposed by security. No visitors allowed.  Smoker: Nicotine patch.  Patient has severe illness, bacteremia.  He may probably need long-term IV antibiotics and may have to stay in the hospital.  This was very well communicated with patient but he is at risk of terminating treatment early and leaving AMA.  Patient does have capacity to make his own decisions and has rights to choose to stay or leave.  Counseled by multiple providers to stay in hospital and get treated.   DVT prophylaxis: enoxaparin (LOVENOX) injection 40 mg Start: 02/14/23 2200   Code Status: Full code Family Communication: None at the bedside Disposition Plan: Status is: Inpatient Remains inpatient appropriate because: Bacteremia, IV antibiotics     Consultants:  None  Procedures:  None  Antimicrobials:  Vancomycin 12/26-12/27 Cefazolin 12/27--     Objective: Vitals:   02/15/23 0527 02/15/23 1124 02/15/23 1134 02/15/23 1135  BP: (!) 106/59 118/68 111/60   Pulse: 84 75 79 79  Resp: 15 18    Temp: 98.1 F (36.7 C) 98.9 F (37.2 C)  99 F (37.2 C)  TempSrc: Oral     SpO2: 100% 95% 97% 97%  Weight:      Height:        Intake/Output Summary (Last 24 hours) at 02/15/2023 1249 Last data filed at 02/15/2023 8119 Gross per 24 hour  Intake 606 ml  Output --  Net 606 ml   Filed Weights   02/14/23 0200  Weight: 68 kg    Examination:  General exam: Slightly anxious.  Not in any distress.  Sleepy  in between conversation. Respiratory system: Clear to auscultation. Respiratory effort normal. Cardiovascular system: S1 & S2 heard, RRR.  Gastrointestinal system: Abdomen is nondistended, soft and nontender. No organomegaly or masses felt. Normal bowel sounds heard. Central nervous system: Alert and oriented. No focal neurological deficits. Extremities: Symmetric 5 x 5 power. Skin:  Red erythema with induration without  fluctuation medial aspect of the right elbow. Red erythema and diffuse swelling dorsum of the left hand.  Range of motion intact. Multiple needle marks. Visible large needle mark left AC.    Data Reviewed: I have personally reviewed following labs and imaging studies  CBC: Recent Labs  Lab 02/14/23 0155 02/15/23 0430  WBC 12.1* 10.5  NEUTROABS 9.7*  --   HGB 11.4* 11.2*  HCT 33.4* 34.3*  MCV 89.3 91.7  PLT 225 191   Basic Metabolic Panel: Recent Labs  Lab 02/14/23 0122 02/15/23 0430  NA 132* 134*  K 4.0 3.6  CL 101 101  CO2 26 23  GLUCOSE 112* 111*  BUN 14 12  CREATININE 0.78 0.63  CALCIUM 8.5* 8.3*  MG 2.3  --   PHOS 3.5  --    GFR: Estimated Creatinine Clearance: 135.8 mL/min (by C-G formula based on SCr of 0.63 mg/dL). Liver Function Tests: Recent Labs  Lab 02/14/23 0122 02/15/23 0430  AST 22 17  ALT 33 26  ALKPHOS 75 70  BILITOT 0.4 0.3  PROT 7.2 6.0*  ALBUMIN 3.5 2.8*   No results for input(s): "LIPASE", "AMYLASE" in the last 168 hours. No results for input(s): "AMMONIA" in the last 168 hours. Coagulation Profile: No results for input(s): "INR", "PROTIME" in the last 168 hours. Cardiac Enzymes: No results for input(s): "CKTOTAL", "CKMB", "CKMBINDEX", "TROPONINI" in the last 168 hours. BNP (last 3 results) No results for input(s): "PROBNP" in the last 8760 hours. HbA1C: No results for input(s): "HGBA1C" in the last 72 hours. CBG: No results for input(s): "GLUCAP" in the last 168 hours. Lipid Profile: No results for input(s): "CHOL", "HDL", "LDLCALC", "TRIG", "CHOLHDL", "LDLDIRECT" in the last 72 hours. Thyroid Function Tests: No results for input(s): "TSH", "T4TOTAL", "FREET4", "T3FREE", "THYROIDAB" in the last 72 hours. Anemia Panel: No results for input(s): "VITAMINB12", "FOLATE", "FERRITIN", "TIBC", "IRON", "RETICCTPCT" in the last 72 hours. Sepsis Labs: Recent Labs  Lab 02/14/23 0122  LATICACIDVEN 0.7    Recent Results (from the past  240 hours)  Blood culture (routine x 2)     Status: None (Preliminary result)   Collection Time: 02/14/23  1:58 AM   Specimen: BLOOD  Result Value Ref Range Status   Specimen Description   Final    BLOOD SITE NOT SPECIFIED Performed at [facility], [address], [place], [place] [address]    Special Requests   Final    BOTTLES DRAWN AEROBIC AND ANAEROBIC Blood Culture results may not be optimal due to an inadequate volume of blood received in culture bottles Performed at [facility], [address], [place], [place] [address]    Culture  Setup Time   Final    GRAM POSITIVE COCCI IN CHAINS ANAEROBIC BOTTLE ONLY CRITICAL RESULT CALLED TO, READ BACK BY AND VERIFIED WITH: [facility] CRYSTAL ROBERTSON [phone] AT 0819 BY EC Performed at [facility], [address], [place], [place] [address]    Culture [facility] POSITIVE COCCI  Final   Report Status PENDING  Incomplete  Blood Culture ID Panel (Reflexed)     Status: Abnormal   Collection Time:  02/14/23  1:58 AM  Result Value Ref Range Status   Enterococcus faecalis NOT DETECTED NOT DETECTED Final   Enterococcus Faecium NOT DETECTED NOT DETECTED Final   Listeria monocytogenes NOT DETECTED NOT DETECTED Final   Staphylococcus species NOT DETECTED NOT DETECTED Final   Staphylococcus aureus (BCID) NOT DETECTED NOT DETECTED Final   Staphylococcus epidermidis NOT DETECTED NOT DETECTED Final   Staphylococcus lugdunensis NOT DETECTED NOT DETECTED Final   Streptococcus species DETECTED (A) NOT DETECTED Final    Comment: CRITICAL RESULT CALLED TO, READ BACK BY AND VERIFIED WITH: PHARMD CRYSTAL ROBERTSON [phone] AT 0819 BY EC    Streptococcus agalactiae NOT DETECTED NOT DETECTED Final   Streptococcus pneumoniae NOT DETECTED NOT DETECTED Final   Streptococcus pyogenes DETECTED (A) NOT DETECTED Final    Comment: CRITICAL RESULT CALLED TO, READ BACK BY AND VERIFIED WITH: PHARMD CRYSTAL ROBERTSON [phone] AT  0819 BY EC    A.calcoaceticus-baumannii NOT DETECTED NOT DETECTED Final   Bacteroides fragilis NOT DETECTED NOT DETECTED Final   Enterobacterales NOT DETECTED NOT DETECTED Final   Enterobacter cloacae complex NOT DETECTED NOT DETECTED Final   Escherichia coli NOT DETECTED NOT DETECTED Final   Klebsiella aerogenes NOT DETECTED NOT DETECTED Final   Klebsiella oxytoca NOT DETECTED NOT DETECTED Final   Klebsiella pneumoniae NOT DETECTED NOT DETECTED Final   Proteus species NOT DETECTED NOT DETECTED Final   Salmonella species NOT DETECTED NOT DETECTED Final   Serratia marcescens NOT DETECTED NOT DETECTED Final   Haemophilus influenzae NOT DETECTED NOT DETECTED Final   Neisseria meningitidis NOT DETECTED NOT DETECTED Final   Pseudomonas aeruginosa NOT DETECTED NOT DETECTED Final   Stenotrophomonas maltophilia NOT DETECTED NOT DETECTED Final   Candida albicans NOT DETECTED NOT DETECTED Final   Candida auris NOT DETECTED NOT DETECTED Final   Candida glabrata NOT DETECTED NOT DETECTED Final   Candida krusei NOT DETECTED NOT DETECTED Final   Candida parapsilosis NOT DETECTED NOT DETECTED Final   Candida tropicalis NOT DETECTED NOT DETECTED Final   Cryptococcus neoformans/gattii NOT DETECTED NOT DETECTED Final    Comment: Performed at [facility], [address], [place], [place] [address]  Blood culture (routine x 2)     Status: None (Preliminary result)   Collection Time: 02/14/23  3:11 AM   Specimen: BLOOD  Result Value Ref Range Status   Specimen Description   Final    BLOOD SITE NOT SPECIFIED Performed at [facility], [address] [place], [place] [address]    Special Requests   Final    BOTTLES DRAWN AEROBIC AND ANAEROBIC Blood Culture adequate volume Performed at [facility], [address] [place], [place] [address]    Culture   Final    NO GROWTH < 24 HOURS Performed at [facility], [address], [place], [place]  [address]    Report Status PENDING  Incomplete         Radiology Studies: DG Hand Complete Left Result Date: 02/14/2023 CLINICAL DATA:  Cellulitis left hand and wrist. EXAM: LEFT HAND - COMPLETE 3+ VIEW COMPARISON:  None Available. FINDINGS: Soft tissue swelling along the dorsum of the hand. No radiopaque foreign body. No acute bony abnormality. Specifically, no fracture, subluxation, or dislocation. Joint spaces maintained. IMPRESSION: Dorsal soft tissue swelling.  No acute bony abnormality. Electronically Signed   By: Charlett Nose M.D.   On: 02/14/2023 01:40   DG Wrist Complete Left Result Date: 02/14/2023 CLINICAL DATA:  Cellulitis  left hand and wrist. EXAM: LEFT WRIST - COMPLETE 3+ VIEW COMPARISON:  None Available. FINDINGS: Soft tissue swelling along the dorsum of the hand. No acute bony abnormality. Specifically, no fracture, subluxation, or dislocation. No radiopaque foreign body. Joint spaces maintained. IMPRESSION: Dorsal soft tissue swelling.  No acute bony abnormality. Electronically Signed   By: Charlett Nose M.D.   On: 02/14/2023 01:39   DG Forearm Right Result Date: 02/14/2023 CLINICAL DATA:  Cellulitis right forearm EXAM: RIGHT FOREARM - 2 VIEW COMPARISON:  None Available. FINDINGS: There is no evidence of fracture or other focal bone lesions. Soft tissues are unremarkable. No radiopaque foreign body. IMPRESSION: Negative. Electronically Signed   By: Charlett Nose M.D.   On: 02/14/2023 01:39        Scheduled Meds:  buprenorphine-naloxone  1 tablet Sublingual BID   enoxaparin (LOVENOX) injection  40 mg Subcutaneous Q24H   feeding supplement  237 mL Oral BID BM   ketorolac  30 mg Intravenous Q6H   nicotine  21 mg Transdermal Daily   Continuous Infusions:   ceFAZolin (ANCEF) IV 2 g (02/15/23 1022)     LOS: 1 day    Time spent: 50 minutes    Dorcas Carrow, MD [facility]

## 2023-02-15 NOTE — Progress Notes (Signed)
Chaplain follow up on Spiritual Care Consult for assessment.  Consulted with Pensions consultant.  Pt not appropriate for assessment at time of visit.  Will forward to oncoming coverage for continued support

## 2023-02-15 NOTE — Plan of Care (Signed)

## 2023-02-15 NOTE — Progress Notes (Addendum)
PHARMACY - PHYSICIAN COMMUNICATION CRITICAL VALUE ALERT - BLOOD CULTURE IDENTIFICATION (BCID)  Thomas Walter is an 27 y.o. male who presented to [facility] on 02/14/2023 with a chief complaint of LUE cellulitis. Patient has a history of IVDU.  Assessment: 1/4 GPC in chains, BCID showing strep pyogenes (no resistance detected)  Name of physician (or Provider) Contacted: Dr. Jerral Ralph  Current antibiotics: vancomycin 1g IV q8hrs  Changes to prescribed antibiotics recommended:  Patient has a penicillin allergy listed but the reaction is not specified anywhere in his chart. Will stick with vancomycin for now until penicillin allergy is assessed--consider switching to cefazolin if allergy is minor.  Results for orders placed or performed during the hospital encounter of 02/14/23  Blood Culture ID Panel (Reflexed) (Collected: 02/14/2023  1:58 AM)  Result Value Ref Range   Enterococcus faecalis NOT DETECTED NOT DETECTED   Enterococcus Faecium NOT DETECTED NOT DETECTED   Listeria monocytogenes NOT DETECTED NOT DETECTED   Staphylococcus species NOT DETECTED NOT DETECTED   Staphylococcus aureus (BCID) NOT DETECTED NOT DETECTED   Staphylococcus epidermidis NOT DETECTED NOT DETECTED   Staphylococcus lugdunensis NOT DETECTED NOT DETECTED   Streptococcus species DETECTED (A) NOT DETECTED   Streptococcus agalactiae NOT DETECTED NOT DETECTED   Streptococcus pneumoniae NOT DETECTED NOT DETECTED   Streptococcus pyogenes DETECTED (A) NOT DETECTED   A.calcoaceticus-baumannii NOT DETECTED NOT DETECTED   Bacteroides fragilis NOT DETECTED NOT DETECTED   Enterobacterales NOT DETECTED NOT DETECTED   Enterobacter cloacae complex NOT DETECTED NOT DETECTED   Escherichia coli NOT DETECTED NOT DETECTED   Klebsiella aerogenes NOT DETECTED NOT DETECTED   Klebsiella oxytoca NOT DETECTED NOT DETECTED   Klebsiella pneumoniae NOT DETECTED NOT DETECTED   Proteus species NOT DETECTED NOT DETECTED   Salmonella species  NOT DETECTED NOT DETECTED   Serratia marcescens NOT DETECTED NOT DETECTED   Haemophilus influenzae NOT DETECTED NOT DETECTED   Neisseria meningitidis NOT DETECTED NOT DETECTED   Pseudomonas aeruginosa NOT DETECTED NOT DETECTED   Stenotrophomonas maltophilia NOT DETECTED NOT DETECTED   Candida albicans NOT DETECTED NOT DETECTED   Candida auris NOT DETECTED NOT DETECTED   Candida glabrata NOT DETECTED NOT DETECTED   Candida krusei NOT DETECTED NOT DETECTED   Candida parapsilosis NOT DETECTED NOT DETECTED   Candida tropicalis NOT DETECTED NOT DETECTED   Cryptococcus neoformans/gattii NOT DETECTED NOT DETECTED    ADDENDUM: MD spoke with patient regarding his reported penicillin allergy. Per patient, he has had some sweating with Augmentin in the past but no other symptoms reported. After discussion with MD, will transition from vancomycin to cefazolin therapy and closely monitor for s/sx of a reaction. It should be noted that reactions with cefazolin are unlikely as it has a different side chain than other beta lactams.  Plan: Stop vancomycin therapy  Start cefazolin 2g IV q8hrs Monitor renal function, cultures, s/sx of reaction with cefazolin, and overall clinical picture De-escalate antibiotics as able   Cherylin Mylar, PharmD Clinical Pharmacist  12/27/20248:43 AM

## 2023-02-15 NOTE — Progress Notes (Addendum)
Security called as monitoring station Development worker, community. Pt had been in bathroom for 15 minutes. Security check was completed and patient was found to have 2 syringes in his hand and admitted that one contained heroin. Security staff was called and arrived on floor. Nurse Supervisor was notified. Charge nurse at bedside. Room searched a  third time with patient consent. All personal belongings removed from patients room and bagged behind nurses station. Dr. Jerral Ralph made aware. At this time, patient is lethargic again and in bed. Messaged MD for further instructions. Vitals:   02/15/23 1516 02/15/23 1815  BP: (!) 97/51 133/65  Pulse: 77 (!) 104  Resp:    Temp: (!) 97.4 F (36.3 C) 98.9 F (37.2 C)  SpO2: 99% 98%   Tele sitter remains on for safety monitor

## 2023-02-16 DIAGNOSIS — L03114 Cellulitis of left upper limb: Secondary | ICD-10-CM | POA: Diagnosis not present

## 2023-02-16 LAB — COMPREHENSIVE METABOLIC PANEL
ALT: 20 U/L (ref 0–44)
AST: 15 U/L (ref 15–41)
Albumin: 2.6 g/dL — ABNORMAL LOW (ref 3.5–5.0)
Alkaline Phosphatase: 72 U/L (ref 38–126)
Anion gap: 6 (ref 5–15)
BUN: 14 mg/dL (ref 6–20)
CO2: 27 mmol/L (ref 22–32)
Calcium: 8.1 mg/dL — ABNORMAL LOW (ref 8.9–10.3)
Chloride: 104 mmol/L (ref 98–111)
Creatinine, Ser: 0.69 mg/dL (ref 0.61–1.24)
GFR, Estimated: >60 mL/min (ref >60)
Glucose, Bld: 97 mg/dL (ref 70–99)
Potassium: 4.2 mmol/L (ref 3.5–5.1)
Sodium: 137 mmol/L (ref 135–145)
Total Bilirubin: 0.2 mg/dL (ref <1.2)
Total Protein: 6.4 g/dL — ABNORMAL LOW (ref 6.5–8.1)

## 2023-02-16 LAB — CBC WITH DIFFERENTIAL/PLATELET
Abs Immature Granulocytes: 0.04 10*3/uL (ref 0.00–0.07)
Basophils Absolute: 0 10*3/uL (ref 0.0–0.1)
Basophils Relative: 0 %
Eosinophils Absolute: 0.2 10*3/uL (ref 0.0–0.5)
Eosinophils Relative: 2 %
HCT: 36.2 % — ABNORMAL LOW (ref 39.0–52.0)
Hemoglobin: 11.5 g/dL — ABNORMAL LOW (ref 13.0–17.0)
Immature Granulocytes: 0 %
Lymphocytes Relative: 19 %
Lymphs Abs: 1.9 10*3/uL (ref 0.7–4.0)
MCH: 29.3 pg (ref 26.0–34.0)
MCHC: 31.8 g/dL (ref 30.0–36.0)
MCV: 92.3 fL (ref 80.0–100.0)
Monocytes Absolute: 0.8 10*3/uL (ref 0.1–1.0)
Monocytes Relative: 8 %
Neutro Abs: 7.2 10*3/uL (ref 1.7–7.7)
Neutrophils Relative %: 71 %
Platelets: 221 10*3/uL (ref 150–400)
RBC: 3.92 MIL/uL — ABNORMAL LOW (ref 4.22–5.81)
RDW: 13.3 % (ref 11.5–15.5)
WBC: 10.1 10*3/uL (ref 4.0–10.5)
nRBC: 0 % (ref 0.0–0.2)

## 2023-02-16 LAB — PHOSPHORUS: Phosphorus: 4.1 mg/dL (ref 2.5–4.6)

## 2023-02-16 LAB — MAGNESIUM: Magnesium: 2.1 mg/dL (ref 1.7–2.4)

## 2023-02-16 MED ORDER — HYDROMORPHONE HCL 1 MG/ML IJ SOLN
1.0000 mg | INTRAMUSCULAR | Status: DC | PRN
  Administered 2023-02-16 – 2023-02-19 (×10): 1 mg via INTRAVENOUS
  Filled 2023-02-16 (×11): qty 1

## 2023-02-16 MED ORDER — KETOROLAC TROMETHAMINE 30 MG/ML IJ SOLN
30.0000 mg | Freq: Four times a day (QID) | INTRAMUSCULAR | Status: AC | PRN
  Administered 2023-02-16 – 2023-02-19 (×10): 30 mg via INTRAVENOUS
  Filled 2023-02-16 (×10): qty 1

## 2023-02-16 NOTE — Plan of Care (Signed)
  Problem: Clinical Measurements: Goal: Will remain free from infection Outcome: Progressing   Problem: Nutrition: Goal: Adequate nutrition will be maintained Outcome: Progressing   Problem: Coping: Goal: Level of anxiety will decrease Outcome: Progressing   Problem: Pain Management: Goal: General experience of comfort will improve Outcome: Progressing

## 2023-02-16 NOTE — Progress Notes (Signed)
Darl Pikes, RN called me in room, upon entering Darl Pikes found a little plastic bag with some type of blue substance in it. Darl Pikes states the bag was in bed with pt. I asked pt what was it and he stated it was heroin. I asked pt how was he using it and he stated he was snorting it because they took all his syringes yesterday. I educated pt on importance and safety of not using any illicit drugs while in hospital and that we are here to help him. I educated pt on importance of taking subaxone, pt continued to refuse. Darl Pikes and myself did another room and person search with consent from pt. MD, [facility], and security all made aware. Substance given to security to waste. MD ordered 1:1 physical sitter.

## 2023-02-16 NOTE — Plan of Care (Signed)

## 2023-02-16 NOTE — Progress Notes (Signed)
PROGRESS NOTE    Thomas Walter  LOV:[id number] DOB: [birth year] DOA: 02/14/2023 PCP: Ronnald Nian, MD    Brief Narrative:  27 year old with history of ADD, IV heroin use presented with 3-days history of left upper extremity erythema, edema, pain as well as right elbow pain.  In the emergency room white cell count 12.1, hemoglobin 11.4.  X-ray showing soft tissue swelling.  No foreign body. Blood cultures growing Streptococcus. 12/27, used fentanyl in the bathroom along with his girlfriend, TeleSitter initiated and girlfriend removed. Evening, used another dose of fentanyl in the bathroom that was hidden.  Again removed by security.  On tele sitter.  Subjective:  Patient was seen and examined.  Closes his eyes.  Denies any complaints.  Afebrile overnight.  Repeat cultures negative so far. Case discussed with orthopedics, do not recommend any debridement of the right elbow.   Assessment & Plan:   Streptococcal bacteremia: Diffuse soft tissue infection, cellulitis left dorsum of the hand, cellulitis right elbow secondary to heroin injection. -Blood cultures positive for Streptococcus.  On cefazolin and Zyvox.  Will involve ID team.   -Repeat blood cultures positive so far.  2D echo without any evidence of vegetation. - CT scan with contrast left hand with diffuse cellulitis.  No abscess.  CT scan of the right elbow with 4 x 3 cm abscess and diffuse cellulitis.  Case discussed with Dr. Hulda Humphrey, [facility] and recommended conservative management.  I&D only if worsening swelling.    Heroin abuse: Ongoing active use of heroin.  Previously on Suboxone, resumed.  Toradol for pain control. Suspected using heroin in the hospital room. TeleSitter No visitors allowed.  Smoker: Nicotine patch.   DVT prophylaxis: enoxaparin (LOVENOX) injection 40 mg Start: 02/14/23 2200   Code Status: Full code Family Communication: None at the bedside Disposition Plan: Status is: Inpatient Remains  inpatient appropriate because: Bacteremia, IV antibiotics     Consultants:  Infectious disease, will call Orthopedics, curbside  Procedures:  None  Antimicrobials:  Vancomycin 12/26-12/27 Cefazolin 12/27-- Zyvox 12/27--     Objective: Vitals:   02/15/23 1815 02/15/23 2000 02/15/23 2353 02/16/23 0511  BP: 133/65 (!) 108/48 (!) 113/56 110/60  Pulse: (!) 104 85 90 75  Resp:  15 14 14   Temp: 98.9 F (37.2 C) 98.3 F (36.8 C) 98.9 F (37.2 C) 98.5 F (36.9 C)  TempSrc:  Oral Oral Oral  SpO2: 98% 98% 97% 100%  Weight:      Height:        Intake/Output Summary (Last 24 hours) at 02/16/2023 1051 Last data filed at 02/15/2023 1700 Gross per 24 hour  Intake 240 ml  Output --  Net 240 ml   Filed Weights   02/14/23 0200  Weight: 68 kg    Examination:  General exam: Not in any distress.  Sleepy in between conversation. Respiratory system: Clear to auscultation. Respiratory effort normal. Cardiovascular system: S1 & S2 heard, RRR.  Gastrointestinal system: Abdomen is nondistended, soft and nontender. No organomegaly or masses felt. Normal bowel sounds heard. Central nervous system: Alert and oriented. No focal neurological deficits.  Flat affect. Extremities: Symmetric 5 x 5 power. Skin:  Red erythema with induration without fluctuation medial aspect of the right elbow. Red erythema and diffuse swelling dorsum of the left hand.  Range of motion intact. Multiple needle marks. Visible large needle mark left AC.       Data Reviewed: I have personally reviewed following labs and imaging studies  CBC: Recent Labs  Lab 02/14/23 0155 02/15/23 0430 02/16/23 0446  WBC 12.1* 10.5 10.1  NEUTROABS 9.7*  --  7.2  HGB 11.4* 11.2* 11.5*  HCT 33.4* 34.3* 36.2*  MCV 89.3 91.7 92.3  PLT 225 191 221   Basic Metabolic Panel: Recent Labs  Lab 02/14/23 0122 02/15/23 0430 02/16/23 0446  NA 132* 134* 137  K 4.0 3.6 4.2  CL 101 101 104  CO2 26 23 27   GLUCOSE 112*  111* 97  BUN 14 12 14   CREATININE 0.78 0.63 0.69  CALCIUM 8.5* 8.3* 8.1*  MG 2.3  --  2.1  PHOS 3.5  --  4.1   GFR: Estimated Creatinine Clearance: 135.8 mL/min (by C-G formula based on SCr of 0.69 mg/dL). Liver Function Tests: Recent Labs  Lab 02/14/23 0122 02/15/23 0430 02/16/23 0446  AST 22 17 15   ALT 33 26 20  ALKPHOS 75 70 72  BILITOT 0.4 0.3 0.2  PROT 7.2 6.0* 6.4*  ALBUMIN 3.5 2.8* 2.6*   No results for input(s): "LIPASE", "AMYLASE" in the last 168 hours. No results for input(s): "AMMONIA" in the last 168 hours. Coagulation Profile: No results for input(s): "INR", "PROTIME" in the last 168 hours. Cardiac Enzymes: No results for input(s): "CKTOTAL", "CKMB", "CKMBINDEX", "TROPONINI" in the last 168 hours. BNP (last 3 results) No results for input(s): "PROBNP" in the last 8760 hours. HbA1C: No results for input(s): "HGBA1C" in the last 72 hours. CBG: No results for input(s): "GLUCAP" in the last 168 hours. Lipid Profile: No results for input(s): "CHOL", "HDL", "LDLCALC", "TRIG", "CHOLHDL", "LDLDIRECT" in the last 72 hours. Thyroid Function Tests: No results for input(s): "TSH", "T4TOTAL", "FREET4", "T3FREE", "THYROIDAB" in the last 72 hours. Anemia Panel: No results for input(s): "VITAMINB12", "FOLATE", "FERRITIN", "TIBC", "IRON", "RETICCTPCT" in the last 72 hours. Sepsis Labs: Recent Labs  Lab 02/14/23 0122  LATICACIDVEN 0.7    Recent Results (from the past 240 hours)  Blood culture (routine x 2)     Status: None (Preliminary result)   Collection Time: 02/14/23  1:58 AM   Specimen: BLOOD  Result Value Ref Range Status   Specimen Description   Final    BLOOD SITE NOT SPECIFIED Performed at [facility], [address] [place], [place] [address]    Special Requests   Final    BOTTLES DRAWN AEROBIC AND ANAEROBIC Blood Culture results may not be optimal due to an inadequate volume of blood received in culture bottles Performed at [facility], [address] [place], [place] [address]    Culture  Setup Time   Final    GRAM POSITIVE COCCI IN CHAINS ANAEROBIC BOTTLE ONLY CRITICAL RESULT CALLED TO, READ BACK BY AND VERIFIED WITH: [facility] CRYSTAL ROBERTSON [id number] AT 0819 BY EC Performed at [facility], [address], [place], [place] [address]    Culture GRAM POSITIVE COCCI  Final   Report Status PENDING  Incomplete  Blood Culture ID Panel (Reflexed)     Status: Abnormal   Collection Time: 02/14/23  1:58 AM  Result Value Ref Range Status   Enterococcus faecalis NOT DETECTED NOT DETECTED Final   Enterococcus Faecium NOT DETECTED NOT DETECTED Final   Listeria monocytogenes NOT DETECTED NOT DETECTED Final   Staphylococcus species NOT DETECTED NOT DETECTED Final   Staphylococcus aureus (BCID) NOT DETECTED NOT DETECTED Final   Staphylococcus epidermidis NOT DETECTED NOT DETECTED Final   Staphylococcus lugdunensis NOT DETECTED NOT DETECTED Final   Streptococcus species DETECTED (A) NOT  DETECTED Final    Comment: CRITICAL RESULT CALLED TO, READ BACK BY AND VERIFIED WITH: [facility] CRYSTAL ROBERTSON [id number] AT 0819 BY EC    Streptococcus agalactiae NOT DETECTED NOT DETECTED Final   Streptococcus pneumoniae NOT DETECTED NOT DETECTED Final   Streptococcus pyogenes DETECTED (A) NOT DETECTED Final    Comment: CRITICAL RESULT CALLED TO, READ BACK BY AND VERIFIED WITH: [facility] CRYSTAL ROBERTSON [id number] AT 0819 BY EC    A.calcoaceticus-baumannii NOT DETECTED NOT DETECTED Final   Bacteroides fragilis NOT DETECTED NOT DETECTED Final   Enterobacterales NOT DETECTED NOT DETECTED Final   Enterobacter cloacae complex NOT DETECTED NOT DETECTED Final   Escherichia coli NOT DETECTED NOT DETECTED Final   Klebsiella aerogenes NOT DETECTED NOT DETECTED Final   Klebsiella oxytoca NOT DETECTED NOT DETECTED Final   Klebsiella pneumoniae NOT DETECTED NOT DETECTED Final   Proteus species NOT DETECTED NOT DETECTED Final    Salmonella species NOT DETECTED NOT DETECTED Final   Serratia marcescens NOT DETECTED NOT DETECTED Final   Haemophilus influenzae NOT DETECTED NOT DETECTED Final   Neisseria meningitidis NOT DETECTED NOT DETECTED Final   Pseudomonas aeruginosa NOT DETECTED NOT DETECTED Final   Stenotrophomonas maltophilia NOT DETECTED NOT DETECTED Final   Candida albicans NOT DETECTED NOT DETECTED Final   Candida auris NOT DETECTED NOT DETECTED Final   Candida glabrata NOT DETECTED NOT DETECTED Final   Candida krusei NOT DETECTED NOT DETECTED Final   Candida parapsilosis NOT DETECTED NOT DETECTED Final   Candida tropicalis NOT DETECTED NOT DETECTED Final   Cryptococcus neoformans/gattii NOT DETECTED NOT DETECTED Final    Comment: Performed at [facility], [address] [place], [place] [address]  Blood culture (routine x 2)     Status: None (Preliminary result)   Collection Time: 02/14/23  3:11 AM   Specimen: BLOOD  Result Value Ref Range Status   Specimen Description   Final    BLOOD SITE NOT SPECIFIED Performed at [facility], [address], [place], [place] [address]    Special Requests   Final    BOTTLES DRAWN AEROBIC AND ANAEROBIC Blood Culture adequate volume Performed at [facility], [address] [place], [place] [address]    Culture   Final    NO GROWTH 2 DAYS Performed at [facility], [address] [place], [place] [address]    Report Status PENDING  Incomplete  Culture, blood (Routine X 2) w Reflex to ID Panel     Status: None (Preliminary result)   Collection Time: 02/15/23 10:12 AM   Specimen: BLOOD  Result Value Ref Range Status   Specimen Description   Final    BLOOD BLOOD LEFT ARM Performed at [facility], [address], [place], [place] [address]    Special Requests   Final    BOTTLES DRAWN AEROBIC AND ANAEROBIC Blood Culture results may not be optimal due to an inadequate volume of blood  received in culture bottles Performed at [facility], [address] [place], [place] [address]    Culture   Final    NO GROWTH < 24 HOURS Performed at [facility], [address], [place], [place] [address]    Report Status PENDING  Incomplete  Culture, blood (Routine X 2) w Reflex to ID Panel     Status: None (Preliminary result)   Collection Time: 02/15/23 10:17 AM   Specimen: BLOOD  Result Value Ref Range Status  Specimen Description   Final    BLOOD BLOOD LEFT HAND Performed at [facility], [address], [place], [place] [address]    Special Requests   Final    BOTTLES DRAWN AEROBIC ONLY Blood Culture results may not be optimal due to an inadequate volume of blood received in culture bottles Performed at [facility], [address] [place], [place] [address]    Culture   Final    NO GROWTH < 24 HOURS Performed at [facility], [address] [place], [place] [address]    Report Status PENDING  Incomplete         Radiology Studies: CT HAND LEFT W CONTRAST Result Date: 02/15/2023 CLINICAL DATA:  Soft tissue infection suspected, hand, xray done bacteremia, abscess suspected EXAM: CT OF THE UPPER LEFT EXTREMITY WITH CONTRAST TECHNIQUE: Multidetector CT imaging of the left hand was performed according to the standard protocol following intravenous contrast administration. RADIATION DOSE REDUCTION: This exam was performed according to the departmental dose-optimization program which includes automated exposure control, adjustment of the mA and/or kV according to patient size and/or use of iterative reconstruction technique. CONTRAST:  OMNIPAQUE IOHEXOL 300 MG/ML  SOLN COMPARISON:  X-ray 02/14/2023 FINDINGS: Bones/Joint/Cartilage No acute fracture. No dislocation. No significant arthropathy of the hand or wrist. No appreciable joint effusion of the wrist. No erosion or periosteal elevation. Ligaments  Suboptimally assessed by CT. Muscles and Tendons No acute musculotendinous abnormality by CT. Soft tissues Prominent soft tissue swelling and edema of the dorsal hand and wrist. No organized or rim enhancing fluid collections are evident. No soft tissue gas. No radiopaque foreign body. IMPRESSION: 1. Prominent soft tissue swelling and edema of the dorsal hand and wrist, compatible with cellulitis. No organized or rim enhancing fluid collections are evident. 2. No acute osseous abnormality of the left hand or wrist. Electronically Signed   By: Duanne Guess D.O.   On: 02/15/2023 15:07   CT EBLOW RIGHT W CONTRAST Result Date: 02/15/2023 CLINICAL DATA:  Osteomyelitis, elbow abscess suspected EXAM: CT OF THE UPPER RIGHT EXTREMITY WITH CONTRAST TECHNIQUE: Multidetector CT imaging of the upper right extremity was performed according to the standard protocol following intravenous contrast administration. RADIATION DOSE REDUCTION: This exam was performed according to the departmental dose-optimization program which includes automated exposure control, adjustment of the mA and/or kV according to patient size and/or use of iterative reconstruction technique. CONTRAST:  OMNIPAQUE IOHEXOL 300 MG/ML  SOLN COMPARISON:  Forearm x-ray 02/14/2023 FINDINGS: Bones/Joint/Cartilage No acute fracture. No dislocation. No significant arthropathy of the elbow. No elbow joint effusion. No erosion or periosteal elevation. Ligaments Suboptimally assessed by CT. Muscles and Tendons No acute musculotendinous abnormality by CT. No intramuscular fluid collection. Soft tissues Fluid collection in the medial aspect of the antecubital fossa with partial rim enhancement measuring approximately 4.0 x 1.8 x 3.8 cm. Generalized soft tissue swelling is more pronounced anteriorly. No additional fluid collections. No soft tissue gas. No radiopaque foreign body. IMPRESSION: 1. Fluid collection in the medial aspect of the antecubital fossa with  partial rim enhancement measuring approximately 4.0 x 1.8 x 3.8 cm, suspicious for phlegmon/developing abscess. 2. Generalized soft tissue swelling is more pronounced anteriorly and suggestive of cellulitis. 3. No evidence of osteomyelitis or septic arthritis of the right elbow. Electronically Signed   By: Duanne Guess D.O.   On: 02/15/2023 15:01   ECHOCARDIOGRAM COMPLETE Result Date: 02/15/2023    ECHOCARDIOGRAM REPORT   Patient Name:  Thomas A Nealy Date of Exam: 02/15/2023 Medical Rec #:  [id number]    Height:       71.0 in Accession #:    [id number]   Weight:       149.9 lb Date of Birth:  1995/09/27    BSA:          1.865 m Patient Age:    27 years     BP:           106/59 mmHg Patient Gender: M            HR:           66 bpm. Exam Location:  Inpatient Procedure: 2D Echo, Cardiac Doppler and Color Doppler Indications:    Bacteremia  History:        Patient has no prior history of Echocardiogram examinations.  Sonographer:    Karma Ganja Referring Phys: [id number] Dorcas Carrow IMPRESSIONS  1. Left ventricular ejection fraction, by estimation, is 60 to 65%. The left ventricle has normal function. The left ventricle has no regional wall motion abnormalities. Left ventricular diastolic parameters were normal.  2. Right ventricular systolic function is normal. The right ventricular size is normal. Tricuspid regurgitation signal is inadequate for assessing PA pressure.  3. The mitral valve is normal in structure. Trivial mitral valve regurgitation. No evidence of mitral stenosis.  4. The aortic valve is tricuspid. Aortic valve regurgitation is not visualized.  5. The inferior vena cava is normal in size with greater than 50% respiratory variability, suggesting right atrial pressure of 3 mmHg. Comparison(s): No prior Echocardiogram. FINDINGS  Left Ventricle: Left ventricular ejection fraction, by estimation, is 60 to 65%. The left ventricle has normal function. The left ventricle has no regional wall motion  abnormalities. The left ventricular internal cavity size was normal in size. There is  no left ventricular hypertrophy. Left ventricular diastolic parameters were normal. Right Ventricle: The right ventricular size is normal. Right ventricular systolic function is normal. Tricuspid regurgitation signal is inadequate for assessing PA pressure. The tricuspid regurgitant velocity is 1.90 m/s, and with an assumed right atrial  pressure of 3 mmHg, the estimated right ventricular systolic pressure is 17.4 mmHg. Left Atrium: Left atrial size was normal in size. Right Atrium: Right atrial size was normal in size. Pericardium: There is no evidence of pericardial effusion. Mitral Valve: The mitral valve is normal in structure. Trivial mitral valve regurgitation. No evidence of mitral valve stenosis. Tricuspid Valve: The tricuspid valve is normal in structure. Tricuspid valve regurgitation is trivial. No evidence of tricuspid stenosis. Aortic Valve: The aortic valve is tricuspid. Aortic valve regurgitation is not visualized. Aortic valve mean gradient measures 3.0 mmHg. Aortic valve peak gradient measures 6.6 mmHg. Aortic valve area, by VTI measures 3.09 cm. Pulmonic Valve: The pulmonic valve was normal in structure. Pulmonic valve regurgitation is not visualized. No evidence of pulmonic stenosis. Aorta: The aortic root is normal in size and structure. Venous: The inferior vena cava is normal in size with greater than 50% respiratory variability, suggesting right atrial pressure of 3 mmHg. IAS/Shunts: No atrial level shunt detected by color flow Doppler.  LEFT VENTRICLE PLAX 2D LVIDd:         5.10 cm   Diastology LVIDs:         3.20 cm   LV e' medial:    15.40 cm/s LV PW:         0.70 cm   LV E/e' medial:  5.1 LV IVS:  0.70 cm   LV e' lateral:   14.70 cm/s LVOT diam:     2.20 cm   LV E/e' lateral: 5.4 LV SV:         72 LV SV Index:   39 LVOT Area:     3.80 cm  RIGHT VENTRICLE             IVC RV Basal diam:  4.00 cm      IVC diam: 2.00 cm RV S prime:     13.90 cm/s TAPSE (M-mode): 3.6 cm LEFT ATRIUM             Index        RIGHT ATRIUM           Index LA diam:        3.20 cm 1.72 cm/m   RA Area:     18.30 cm LA Vol (A2C):   40.9 ml 21.93 ml/m  RA Volume:   54.50 ml  29.22 ml/m LA Vol (A4C):   24.5 ml 13.13 ml/m LA Biplane Vol: 32.4 ml 17.37 ml/m  AORTIC VALVE AV Area (Vmax):    3.03 cm AV Area (Vmean):   3.02 cm AV Area (VTI):     3.09 cm AV Vmax:           128.00 cm/s AV Vmean:          80.700 cm/s AV VTI:            0.234 m AV Peak Grad:      6.6 mmHg AV Mean Grad:      3.0 mmHg LVOT Vmax:         102.00 cm/s LVOT Vmean:        64.100 cm/s LVOT VTI:          0.190 m LVOT/AV VTI ratio: 0.81  AORTA Ao Root diam: 3.20 cm MITRAL VALVE               TRICUSPID VALVE MV Area (PHT): 3.77 cm    TR Peak grad:   14.4 mmHg MV Decel Time: 201 msec    TR Vmax:        190.00 cm/s MV E velocity: 78.80 cm/s MV A velocity: 42.40 cm/s  SHUNTS MV E/A ratio:  1.86        Systemic VTI:  0.19 m                            Systemic Diam: 2.20 cm Olga Millers MD Electronically signed by Olga Millers MD Signature Date/Time: 02/15/2023/2:40:40 PM    Final         Scheduled Meds:  buprenorphine-naloxone  1 tablet Sublingual BID   enoxaparin (LOVENOX) injection  40 mg Subcutaneous Q24H   feeding supplement  237 mL Oral BID BM   linezolid  600 mg Oral Q12H   nicotine  21 mg Transdermal Daily   Continuous Infusions:   ceFAZolin (ANCEF) IV 2 g (02/16/23 0234)     LOS: 2 days    Time spent: 50 minutes    Dorcas Carrow, MD [facility]

## 2023-02-17 DIAGNOSIS — R7881 Bacteremia: Secondary | ICD-10-CM

## 2023-02-17 DIAGNOSIS — B950 Streptococcus, group A, as the cause of diseases classified elsewhere: Secondary | ICD-10-CM

## 2023-02-17 DIAGNOSIS — L03114 Cellulitis of left upper limb: Secondary | ICD-10-CM | POA: Diagnosis not present

## 2023-02-17 LAB — CULTURE, BLOOD (ROUTINE X 2)

## 2023-02-17 MED ORDER — BUPRENORPHINE HCL-NALOXONE HCL 8-2 MG SL SUBL
1.0000 | SUBLINGUAL_TABLET | Freq: Two times a day (BID) | SUBLINGUAL | Status: DC
  Administered 2023-02-17 – 2023-02-19 (×5): 1 via SUBLINGUAL
  Filled 2023-02-17 (×5): qty 1

## 2023-02-17 NOTE — Consult Note (Signed)
[facility]                                                                                        Patient Identification: Patient Name: Thomas Walter MRN: [id number] Admit Date: 02/14/2023 12:40 AM Today's Date: 02/17/2023 Reason for consult: Bacteremia  Requesting provider: Dr Jerral Ralph   Principal Problem:   Cellulitis of left upper extremity Active Problems:   Heroin abuse (HCC)   IVDU (intravenous drug user)   Hyponatremia   Normocytic anemia   Antibiotics:  Vancomycin 12/25-12/26 Linezolid 12/27- Cefazolin 12/27-c  Lines/Hardware:  Assessment # Group A strep pyogenes bacteremia ( no Toxic shock syndrome) 2/2  12/27 Repeat blood cx NG in 2 days  12/27 TTE no vegetations   # RT elbow abscess- no signs of nec fasc # Left hand/wrist cellulitis - improving  # Active IVDU   Recommendations  - continue cefazolin ( has amoxicillin allergy in chart, unable to clarify today) for now. I will leave linezolid pending ortho evaluation to help with toxin inhibition  - fu repeat blood cx - appears Ortho was consulted over phone yesterday and was recommended to continue conservative management and I and D if abtx fail. Still has pretty big abscess around 4 cm at least and expect will need I and D. Engage ortho to formally see.  - Hepatitis B and C serology ordered  - d/w primary team   Rest of the management as per the primary team. Please call with questions or concerns.  Thank you for the consult  __________________________________________________________________________________________________________ HPI and Hospital Course ( History obtained from chart review as patient sleeping and not waking up to speak)  27 year old male with PMH of ADD, left varicocele, IVDU who presented to the ED on 12/26 brought in by EMS from hotel for left-sided arm pain/swelling, redness radiating to the left  wrist with reported use of heroin He also had fever, chills but no other symptoms like chest pain, cough or shortness of breath.  No GI or GU symptoms.   At ED, afebrile Labs remarkable for lactic acid 0.7, WBC 12.1 Received IV analgesics, IVF IV antiemetics and was started on vancomycin for concern of cellulitis  CT elbow 12/27 1. Fluid collection in the medial aspect of the antecubital fossa with partial rim enhancement measuring approximately 4.0 x 1.8 x 3.8 cm, suspicious for phlegmon/developing abscess. 2. Generalized soft tissue swelling is more pronounced anteriorly and suggestive of cellulitis. 3. No evidence of osteomyelitis or septic arthritis of the right elbow.  CT left hand 12/27 1. Prominent soft tissue swelling and edema of the dorsal hand and wrist, compatible with cellulitis. No organized or rim enhancing fluid collections are evident. 2. No acute osseous abnormality of the left hand or wrist.  ROS: unable to obtain ROS and patient would not wake up and tries to sleep  Past Medical History:  Diagnosis Date   ADD (attention deficit disorder with hyperactivity)    Left varicocele    Past Surgical History:  Procedure Laterality Date   MYRINGOTOMY     CROSSLEY    Scheduled Meds:  buprenorphine-naloxone  1  tablet Sublingual BID   enoxaparin (LOVENOX) injection  40 mg Subcutaneous Q24H   feeding supplement  237 mL Oral BID BM   linezolid  600 mg Oral Q12H   nicotine  21 mg Transdermal Daily   Continuous Infusions:   ceFAZolin (ANCEF) IV 2 g (02/17/23 0245)   PRN Meds:.acetaminophen **OR** acetaminophen, HYDROmorphone (DILAUDID) injection, ketorolac, ondansetron **OR** ondansetron (ZOFRAN) IV, traMADol  Allergies  Allergen Reactions   Amoxicillin    Amoxicillin [Amoxicillin]    Amoxicillin-Pot Clavulanate    Social History   Socioeconomic History   Marital status: Single    Spouse name: Not on file   Number of children: Not on file   Years of  education: Not on file   Highest education level: Not on file  Occupational History   Not on file  Tobacco Use   Smoking status: Never   Smokeless tobacco: Never  Vaping Use   Vaping status: Every Day  Substance and Sexual Activity   Alcohol use: No   Drug use: Yes    Types: IV    Comment: heroin daily last used 6 hours pta   Sexual activity: Yes  Other Topics Concern   Not on file  Social History Narrative   Not on file   Social Drivers of Health   Financial Resource Strain: Not on file  Food Insecurity: No Food Insecurity (02/14/2023)   Hunger Vital Sign    Worried About Running Out of Food in the Last Year: Never true    Ran Out of Food in the Last Year: Never true  Transportation Needs: No Transportation Needs (02/14/2023)   PRAPARE - Administrator, Civil Service (Medical): No    Lack of Transportation (Non-Medical): No  Physical Activity: Not on file  Stress: Not on file  Social Connections: Not on file  Intimate Partner Violence: Not At Risk (02/14/2023)   Humiliation, Afraid, Rape, and Kick questionnaire    Fear of Current or Ex-Partner: No    Emotionally Abused: No    Physically Abused: No    Sexually Abused: No   History reviewed. No pertinent family history.  Vitals BP (!) 113/53 (BP Location: Right Arm)   Pulse 64   Temp 98.3 F (36.8 C) (Oral)   Resp 15   Ht 5\' 11"  (1.803 m)   Wt 68 kg   SpO2 99%   BMI 20.91 kg/m    Physical Exam Constitutional:  he is sleeping comfortably, not in acute distress , has a sitter     Comments: HEENT wnl   Cardiovascular:     Rate and Rhythm: Normal rate and regular rhythm.     Heart sounds:  Pulmonary:     Effort: Pulmonary effort is normal.     Comments:   Abdominal:     Palpations: Abdomen is soft.     Tenderness:   Musculoskeletal:        General: Rt elbow swelling, warmth and erythematous, tender, some fluctuance,  no crepitus, no ascending or descending cellulitis, ROM of rt elbow is  restricted.   Left dorsal hand with mild indurated area, no fluctuance, no warmth, redness seems to have improved. ROM of left hand and wrist is good.   Skin:    Comments: No rashes . tattoos  Neurological:     General: he is sleeping    Pertinent Microbiology Results for orders placed or performed during the hospital encounter of 02/14/23  Blood culture (routine x 2)  Status: Abnormal   Collection Time: 02/14/23  1:58 AM   Specimen: BLOOD  Result Value Ref Range Status   Specimen Description   Final    BLOOD SITE NOT SPECIFIED Performed at [facility], [address] [place], [place] [address]    Special Requests   Final    BOTTLES DRAWN AEROBIC AND ANAEROBIC Blood Culture results may not be optimal due to an inadequate volume of blood received in culture bottles Performed at [facility], [address], [place], [place] [address]    Culture  Setup Time   Final    GRAM POSITIVE COCCI IN CHAINS ANAEROBIC BOTTLE ONLY CRITICAL RESULT CALLED TO, READ BACK BY AND VERIFIED WITH: PHARMD CRYSTAL ROBERTSON [id number] AT 0819 BY EC    Culture (A)  Final    GROUP A STREP (S.PYOGENES) ISOLATED HEALTH DEPARTMENT NOTIFIED Performed at [facility], [address], [place], [place] [address]    Report Status 02/17/2023 FINAL  Final   Organism ID, Bacteria GROUP A STREP (S.PYOGENES) ISOLATED  Final      Susceptibility   Group a strep (s.pyogenes) isolated - MIC*    PENICILLIN <=0.06 SENSITIVE Sensitive     CEFTRIAXONE <=0.12 SENSITIVE Sensitive     ERYTHROMYCIN <=0.12 SENSITIVE Sensitive     LEVOFLOXACIN 2 SENSITIVE Sensitive     VANCOMYCIN 0.5 SENSITIVE Sensitive     * GROUP A STREP (S.PYOGENES) ISOLATED  Blood Culture ID Panel (Reflexed)     Status: Abnormal   Collection Time: 02/14/23  1:58 AM  Result Value Ref Range Status   Enterococcus faecalis NOT DETECTED NOT DETECTED Final   Enterococcus Faecium NOT DETECTED NOT DETECTED Final    Listeria monocytogenes NOT DETECTED NOT DETECTED Final   Staphylococcus species NOT DETECTED NOT DETECTED Final   Staphylococcus aureus (BCID) NOT DETECTED NOT DETECTED Final   Staphylococcus epidermidis NOT DETECTED NOT DETECTED Final   Staphylococcus lugdunensis NOT DETECTED NOT DETECTED Final   Streptococcus species DETECTED (A) NOT DETECTED Final    Comment: CRITICAL RESULT CALLED TO, READ BACK BY AND VERIFIED WITH: PHARMD CRYSTAL ROBERTSON [id number] AT 0819 BY EC    Streptococcus agalactiae NOT DETECTED NOT DETECTED Final   Streptococcus pneumoniae NOT DETECTED NOT DETECTED Final   Streptococcus pyogenes DETECTED (A) NOT DETECTED Final    Comment: CRITICAL RESULT CALLED TO, READ BACK BY AND VERIFIED WITH: PHARMD CRYSTAL ROBERTSON [phone] AT 0819 BY EC    A.calcoaceticus-baumannii NOT DETECTED NOT DETECTED Final   Bacteroides fragilis NOT DETECTED NOT DETECTED Final   Enterobacterales NOT DETECTED NOT DETECTED Final   Enterobacter cloacae complex NOT DETECTED NOT DETECTED Final   Escherichia coli NOT DETECTED NOT DETECTED Final   Klebsiella aerogenes NOT DETECTED NOT DETECTED Final   Klebsiella oxytoca NOT DETECTED NOT DETECTED Final   Klebsiella pneumoniae NOT DETECTED NOT DETECTED Final   Proteus species NOT DETECTED NOT DETECTED Final   Salmonella species NOT DETECTED NOT DETECTED Final   Serratia marcescens NOT DETECTED NOT DETECTED Final   Haemophilus influenzae NOT DETECTED NOT DETECTED Final   Neisseria meningitidis NOT DETECTED NOT DETECTED Final   Pseudomonas aeruginosa NOT DETECTED NOT DETECTED Final   Stenotrophomonas maltophilia NOT DETECTED NOT DETECTED Final   Candida albicans NOT DETECTED NOT DETECTED Final   Candida auris NOT DETECTED NOT DETECTED Final   Candida glabrata NOT DETECTED NOT DETECTED Final   Candida krusei NOT DETECTED NOT DETECTED Final   Candida parapsilosis NOT DETECTED NOT DETECTED  Final   Candida tropicalis NOT DETECTED NOT DETECTED Final    Cryptococcus neoformans/gattii NOT DETECTED NOT DETECTED Final    Comment: Performed at [facility], [address], [place], [place] [address]  Blood culture (routine x 2)     Status: None (Preliminary result)   Collection Time: 02/14/23  3:11 AM   Specimen: BLOOD  Result Value Ref Range Status   Specimen Description   Final    BLOOD SITE NOT SPECIFIED Performed at [facility], [address], [place], [place] [address]    Special Requests   Final    BOTTLES DRAWN AEROBIC AND ANAEROBIC Blood Culture adequate volume Performed at [facility], [address], [place], [place] [address]    Culture   Final    NO GROWTH 3 DAYS Performed at [facility], [address] [place], [place] [address]    Report Status PENDING  Incomplete  Culture, blood (Routine X 2) w Reflex to ID Panel     Status: None (Preliminary result)   Collection Time: 02/15/23 10:12 AM   Specimen: BLOOD LEFT ARM  Result Value Ref Range Status   Specimen Description   Final    BLOOD LEFT ARM Performed at [facility], [address] [place], [place] [address]    Special Requests   Final    BOTTLES DRAWN AEROBIC AND ANAEROBIC Blood Culture results may not be optimal due to an inadequate volume of blood received in culture bottles Performed at [facility], [address], [place], [place] [address]    Culture   Final    NO GROWTH 2 DAYS Performed at [facility], [address], [place], [place] [address]    Report Status PENDING  Incomplete  Culture, blood (Routine X 2) w Reflex to ID Panel     Status: None (Preliminary result)   Collection Time: 02/15/23 10:17 AM   Specimen: BLOOD LEFT HAND  Result Value Ref Range Status   Specimen Description   Final    BLOOD LEFT HAND Performed at [facility], [address], [place], [place] [address]    Special Requests   Final    BOTTLES DRAWN AEROBIC ONLY Blood Culture  results may not be optimal due to an inadequate volume of blood received in culture bottles Performed at [facility], [address], [place], [place] [address]    Culture   Final    NO GROWTH 2 DAYS Performed at [facility], [address], [place], [place] [address]    Report Status PENDING  Incomplete   Pertinent Lab seen by me:    Latest Ref Rng & Units 02/16/2023    4:46 AM 02/15/2023    4:30 AM 02/14/2023    1:55 AM  CBC  WBC 4.0 - 10.5 K/uL 10.1  10.5  12.1   Hemoglobin 13.0 - 17.0 g/dL 02.7  25.3  66.4   Hematocrit 39.0 - 52.0 % 36.2  34.3  33.4   Platelets 150 - 400 K/uL 221  191  225       Latest Ref Rng & Units 02/16/2023    4:46 AM 02/15/2023    4:30 AM 02/14/2023    1:22 AM  CMP  Glucose 70 - 99 mg/dL 97  403  474   BUN 6 - 20 mg/dL 14  12  14    Creatinine 0.61 - 1.24 mg/dL 2.59  0.63  0.78   Sodium 135 - 145 mmol/L 137  134  132   Potassium 3.5 - 5.1 mmol/L 4.2  3.6  4.0   Chloride 98 - 111 mmol/L 104  101  101   CO2 22 - 32 mmol/L 27  23  26    Calcium 8.9 - 10.3 mg/dL 8.1  8.3  8.5   Total Protein 6.5 - 8.1 g/dL 6.4  6.0  7.2   Total Bilirubin <1.2 mg/dL 0.2  0.3  0.4   Alkaline Phos 38 - 126 U/L 72  70  75   AST 15 - 41 U/L 15  17  22    ALT 0 - 44 U/L 20  26  33     Pertinent Imagings/Other Imagings Plain films and CT images have been personally visualized and interpreted; radiology reports have been reviewed. Decision making incorporated into the Impression / Recommendations.  CT HAND LEFT W CONTRAST Result Date: 02/15/2023 CLINICAL DATA:  Soft tissue infection suspected, hand, xray done bacteremia, abscess suspected EXAM: CT OF THE UPPER LEFT EXTREMITY WITH CONTRAST TECHNIQUE: Multidetector CT imaging of the left hand was performed according to the standard protocol following intravenous contrast administration. RADIATION DOSE REDUCTION: This exam was performed according to the departmental dose-optimization program which  includes automated exposure control, adjustment of the mA and/or kV according to patient size and/or use of iterative reconstruction technique. CONTRAST:  OMNIPAQUE IOHEXOL 300 MG/ML  SOLN COMPARISON:  X-ray 02/14/2023 FINDINGS: Bones/Joint/Cartilage No acute fracture. No dislocation. No significant arthropathy of the hand or wrist. No appreciable joint effusion of the wrist. No erosion or periosteal elevation. Ligaments Suboptimally assessed by CT. Muscles and Tendons No acute musculotendinous abnormality by CT. Soft tissues Prominent soft tissue swelling and edema of the dorsal hand and wrist. No organized or rim enhancing fluid collections are evident. No soft tissue gas. No radiopaque foreign body. IMPRESSION: 1. Prominent soft tissue swelling and edema of the dorsal hand and wrist, compatible with cellulitis. No organized or rim enhancing fluid collections are evident. 2. No acute osseous abnormality of the left hand or wrist. Electronically Signed   By: Duanne Guess D.O.   On: 02/15/2023 15:07   CT EBLOW RIGHT W CONTRAST Result Date: 02/15/2023 CLINICAL DATA:  Osteomyelitis, elbow abscess suspected EXAM: CT OF THE UPPER RIGHT EXTREMITY WITH CONTRAST TECHNIQUE: Multidetector CT imaging of the upper right extremity was performed according to the standard protocol following intravenous contrast administration. RADIATION DOSE REDUCTION: This exam was performed according to the departmental dose-optimization program which includes automated exposure control, adjustment of the mA and/or kV according to patient size and/or use of iterative reconstruction technique. CONTRAST:  OMNIPAQUE IOHEXOL 300 MG/ML  SOLN COMPARISON:  Forearm x-ray 02/14/2023 FINDINGS: Bones/Joint/Cartilage No acute fracture. No dislocation. No significant arthropathy of the elbow. No elbow joint effusion. No erosion or periosteal elevation. Ligaments Suboptimally assessed by CT. Muscles and Tendons No acute musculotendinous  abnormality by CT. No intramuscular fluid collection. Soft tissues Fluid collection in the medial aspect of the antecubital fossa with partial rim enhancement measuring approximately 4.0 x 1.8 x 3.8 cm. Generalized soft tissue swelling is more pronounced anteriorly. No additional fluid collections. No soft tissue gas. No radiopaque foreign body. IMPRESSION: 1. Fluid collection in the medial aspect of the antecubital fossa with partial rim enhancement measuring approximately 4.0 x 1.8 x 3.8 cm, suspicious for phlegmon/developing abscess. 2. Generalized soft tissue swelling is more pronounced anteriorly and suggestive of cellulitis. 3. No evidence of  osteomyelitis or septic arthritis of the right elbow. Electronically Signed   By: Duanne Guess D.O.   On: 02/15/2023 15:01   ECHOCARDIOGRAM COMPLETE Result Date: 02/15/2023    ECHOCARDIOGRAM REPORT   Patient Name:   Thomas A Tolson Date of Exam: 02/15/2023 Medical Rec #:  [id number]    Height:       71.0 in Accession #:    [id number]   Weight:       149.9 lb Date of Birth:  November 06, 1995    BSA:          1.865 m Patient Age:    27 years     BP:           106/59 mmHg Patient Gender: M            HR:           66 bpm. Exam Location:  Inpatient Procedure: 2D Echo, Cardiac Doppler and Color Doppler Indications:    Bacteremia  History:        Patient has no prior history of Echocardiogram examinations.  Sonographer:    Karma Ganja Referring Phys: [id number] Dorcas Carrow IMPRESSIONS  1. Left ventricular ejection fraction, by estimation, is 60 to 65%. The left ventricle has normal function. The left ventricle has no regional wall motion abnormalities. Left ventricular diastolic parameters were normal.  2. Right ventricular systolic function is normal. The right ventricular size is normal. Tricuspid regurgitation signal is inadequate for assessing PA pressure.  3. The mitral valve is normal in structure. Trivial mitral valve regurgitation. No evidence of mitral stenosis.  4. The  aortic valve is tricuspid. Aortic valve regurgitation is not visualized.  5. The inferior vena cava is normal in size with greater than 50% respiratory variability, suggesting right atrial pressure of 3 mmHg. Comparison(s): No prior Echocardiogram. FINDINGS  Left Ventricle: Left ventricular ejection fraction, by estimation, is 60 to 65%. The left ventricle has normal function. The left ventricle has no regional wall motion abnormalities. The left ventricular internal cavity size was normal in size. There is  no left ventricular hypertrophy. Left ventricular diastolic parameters were normal. Right Ventricle: The right ventricular size is normal. Right ventricular systolic function is normal. Tricuspid regurgitation signal is inadequate for assessing PA pressure. The tricuspid regurgitant velocity is 1.90 m/s, and with an assumed right atrial  pressure of 3 mmHg, the estimated right ventricular systolic pressure is 17.4 mmHg. Left Atrium: Left atrial size was normal in size. Right Atrium: Right atrial size was normal in size. Pericardium: There is no evidence of pericardial effusion. Mitral Valve: The mitral valve is normal in structure. Trivial mitral valve regurgitation. No evidence of mitral valve stenosis. Tricuspid Valve: The tricuspid valve is normal in structure. Tricuspid valve regurgitation is trivial. No evidence of tricuspid stenosis. Aortic Valve: The aortic valve is tricuspid. Aortic valve regurgitation is not visualized. Aortic valve mean gradient measures 3.0 mmHg. Aortic valve peak gradient measures 6.6 mmHg. Aortic valve area, by VTI measures 3.09 cm. Pulmonic Valve: The pulmonic valve was normal in structure. Pulmonic valve regurgitation is not visualized. No evidence of pulmonic stenosis. Aorta: The aortic root is normal in size and structure. Venous: The inferior vena cava is normal in size with greater than 50% respiratory variability, suggesting right atrial pressure of 3 mmHg. IAS/Shunts: No  atrial level shunt detected by color flow Doppler.  LEFT VENTRICLE PLAX 2D LVIDd:         5.10 cm   Diastology LVIDs:  3.20 cm   LV e' medial:    15.40 cm/s LV PW:         0.70 cm   LV E/e' medial:  5.1 LV IVS:        0.70 cm   LV e' lateral:   14.70 cm/s LVOT diam:     2.20 cm   LV E/e' lateral: 5.4 LV SV:         72 LV SV Index:   39 LVOT Area:     3.80 cm  RIGHT VENTRICLE             IVC RV Basal diam:  4.00 cm     IVC diam: 2.00 cm RV S prime:     13.90 cm/s TAPSE (M-mode): 3.6 cm LEFT ATRIUM             Index        RIGHT ATRIUM           Index LA diam:        3.20 cm 1.72 cm/m   RA Area:     18.30 cm LA Vol (A2C):   40.9 ml 21.93 ml/m  RA Volume:   54.50 ml  29.22 ml/m LA Vol (A4C):   24.5 ml 13.13 ml/m LA Biplane Vol: 32.4 ml 17.37 ml/m  AORTIC VALVE AV Area (Vmax):    3.03 cm AV Area (Vmean):   3.02 cm AV Area (VTI):     3.09 cm AV Vmax:           128.00 cm/s AV Vmean:          80.700 cm/s AV VTI:            0.234 m AV Peak Grad:      6.6 mmHg AV Mean Grad:      3.0 mmHg LVOT Vmax:         102.00 cm/s LVOT Vmean:        64.100 cm/s LVOT VTI:          0.190 m LVOT/AV VTI ratio: 0.81  AORTA Ao Root diam: 3.20 cm MITRAL VALVE               TRICUSPID VALVE MV Area (PHT): 3.77 cm    TR Peak grad:   14.4 mmHg MV Decel Time: 201 msec    TR Vmax:        190.00 cm/s MV E velocity: 78.80 cm/s MV A velocity: 42.40 cm/s  SHUNTS MV E/A ratio:  1.86        Systemic VTI:  0.19 m                            Systemic Diam: 2.20 cm Olga Millers MD Electronically signed by Olga Millers MD Signature Date/Time: 02/15/2023/2:40:40 PM    Final    DG Hand Complete Left Result Date: 02/14/2023 CLINICAL DATA:  Cellulitis left hand and wrist. EXAM: LEFT HAND - COMPLETE 3+ VIEW COMPARISON:  None Available. FINDINGS: Soft tissue swelling along the dorsum of the hand. No radiopaque foreign body. No acute bony abnormality. Specifically, no fracture, subluxation, or dislocation. Joint spaces maintained. IMPRESSION:  Dorsal soft tissue swelling.  No acute bony abnormality. Electronically Signed   By: Charlett Nose M.D.   On: 02/14/2023 01:40   DG Wrist Complete Left Result Date: 02/14/2023 CLINICAL DATA:  Cellulitis left hand and wrist. EXAM: LEFT WRIST - COMPLETE 3+ VIEW COMPARISON:  None Available.  FINDINGS: Soft tissue swelling along the dorsum of the hand. No acute bony abnormality. Specifically, no fracture, subluxation, or dislocation. No radiopaque foreign body. Joint spaces maintained. IMPRESSION: Dorsal soft tissue swelling.  No acute bony abnormality. Electronically Signed   By: Charlett Nose M.D.   On: 02/14/2023 01:39   DG Forearm Right Result Date: 02/14/2023 CLINICAL DATA:  Cellulitis right forearm EXAM: RIGHT FOREARM - 2 VIEW COMPARISON:  None Available. FINDINGS: There is no evidence of fracture or other focal bone lesions. Soft tissues are unremarkable. No radiopaque foreign body. IMPRESSION: Negative. Electronically Signed   By: Charlett Nose M.D.   On: 02/14/2023 01:39    I have personally spent 83 minutes involved in face-to-face and non-face-to-face activities for this patient on the day of the visit. Professional time spent includes the following activities: Preparing to see the patient (review of tests), Obtaining and/or reviewing separately obtained history (admission/discharge record), Performing a medically appropriate examination and/or evaluation , Ordering medications/tests/procedures, referring and communicating with other health care professionals, Documenting clinical information in the EMR, Independently interpreting results (not separately reported), Communicating results to the patient/family/caregiver, Counseling and educating the patient/family/caregiver and Care coordination (not separately reported).  Electronically signed by:   Plan d/w requesting provider as well as ID pharm D  Of note, portions of this note may have been created with voice recognition software. While this  note has been edited for accuracy, occasional wrong-word or sound-a-like substitutions may have occurred due to the inherent limitations of voice recognition software.   Odette Fraction, MD Infectious Disease Physician [facility] Pager: [phone]  '

## 2023-02-17 NOTE — Plan of Care (Signed)
°  Problem: Education: Goal: Knowledge of General Education information will improve Description: Including pain rating scale, medication(s)/side effects and non-pharmacologic comfort measures Outcome: Progressing   Problem: Health Behavior/Discharge Planning: Goal: Ability to manage health-related needs will improve Outcome: Progressing   Problem: Clinical Measurements: Goal: Ability to maintain clinical measurements within normal limits will improve Outcome: Progressing Goal: Will remain free from infection Outcome: Progressing Goal: Diagnostic test results will improve Outcome: Progressing Goal: Respiratory complications will improve Outcome: Progressing Goal: Cardiovascular complication will be avoided Outcome: Progressing   Problem: Nutrition: Goal: Adequate nutrition will be maintained Outcome: Progressing   Problem: Activity: Goal: Risk for activity intolerance will decrease Outcome: Progressing   Problem: Coping: Goal: Level of anxiety will decrease Outcome: Progressing   Problem: Elimination: Goal: Will not experience complications related to bowel motility Outcome: Progressing Goal: Will not experience complications related to urinary retention Outcome: Progressing   Problem: Pain Management: Goal: General experience of comfort will improve Outcome: Progressing   Problem: Skin Integrity: Goal: Risk for impaired skin integrity will decrease Outcome: Progressing

## 2023-02-17 NOTE — Progress Notes (Signed)
PROGRESS NOTE    Thomas Walter  ZOX:[id number] DOB: 28-Aug-1995 DOA: 02/14/2023 PCP: Ronnald Nian, MD    Brief Narrative:  27 year old with history of ADD, IV heroin use presented with 3-days history of left upper extremity erythema, edema, pain as well as right elbow pain.  In the emergency room white cell count 12.1, hemoglobin 11.4.  X-ray showing soft tissue swelling.  No foreign body. Blood cultures growing Streptococcus. Patient was found injecting heroin in the hospital, again snorting heroin in the hospital.  No visitors.  Now remains with bedside sitter.  Subjective:  Seen and examined.  Sleepy.  Given Toradol and Dilaudid due to unrelenting pain on the right elbow.  Afebrile.  Repeat cultures negative so far. Case discussed with orthopedics, do not recommend any debridement of the right elbow.   Assessment & Plan:   Streptococcal bacteremia: Diffuse soft tissue infection, cellulitis left dorsum of the hand, cellulitis right elbow secondary to heroin injection. -Blood cultures positive for Streptococcus.  On cefazolin and Zyvox.  ID will follow. -Repeat blood cultures negative so far.  2D echo without any evidence of vegetation. - CT scan with contrast left hand with diffuse cellulitis.  No abscess.  CT scan of the right elbow with 4 x 3 cm abscess and diffuse cellulitis.  Case discussed with Dr. Hulda Humphrey, [facility] and recommended conservative management.  I&D only if worsening swelling.    Heroin abuse: Ongoing active use of heroin.  Previously on Suboxone, resumed.  Toradol for pain control. Suspected using heroin in the hospital room. No visitors allowed. Patient does have painful condition, using Dilaudid and tramadol, Toradol for acute pain.  Smoker: Nicotine patch.   DVT prophylaxis: enoxaparin (LOVENOX) injection 40 mg Start: 02/14/23 2200   Code Status: Full code Family Communication: None at the bedside Disposition Plan: Status is: Inpatient Remains  inpatient appropriate because: Bacteremia, IV antibiotics     Consultants:  Infectious disease,   Procedures:  None  Antimicrobials:  Vancomycin 12/26-12/27 Cefazolin 12/27-- Zyvox 12/27--     Objective: Vitals:   02/16/23 0511 02/16/23 1205 02/16/23 1948 02/17/23 0622  BP: 110/60 (!) 91/56 (!) 108/50 (!) 113/53  Pulse: 75 74 86 64  Resp: 14 15 14 15   Temp: 98.5 F (36.9 C) 98.2 F (36.8 C) 98.5 F (36.9 C) 98.3 F (36.8 C)  TempSrc: Oral Oral Oral Oral  SpO2: 100% 97% 98% 99%  Weight:      Height:        Intake/Output Summary (Last 24 hours) at 02/17/2023 1107 Last data filed at 02/17/2023 1048 Gross per 24 hour  Intake 360 ml  Output --  Net 360 ml   Filed Weights   02/14/23 0200  Weight: 68 kg    Examination:  General exam: Not in any distress.  Mostly sleepy. Respiratory system: Clear to auscultation. Respiratory effort normal. Cardiovascular system: S1 & S2 heard, RRR.  Gastrointestinal system: Abdomen is nondistended, soft and nontender. No organomegaly or masses felt. Normal bowel sounds heard. Central nervous system: Alert and oriented. No focal neurological deficits.  Flat affect. Extremities: Symmetric 5 x 5 power. Skin:  Red erythema with induration with some fluctuation medial aspect of the right elbow. Red erythema and diffuse swelling dorsum of the left hand.  Range of motion intact. Multiple needle marks. Visible large needle mark left AC.       Data Reviewed: I have personally reviewed following labs and imaging studies  CBC: Recent Labs  Lab 02/14/23 0155  02/15/23 0430 02/16/23 0446  WBC 12.1* 10.5 10.1  NEUTROABS 9.7*  --  7.2  HGB 11.4* 11.2* 11.5*  HCT 33.4* 34.3* 36.2*  MCV 89.3 91.7 92.3  PLT 225 191 221   Basic Metabolic Panel: Recent Labs  Lab 02/14/23 0122 02/15/23 0430 02/16/23 0446  NA 132* 134* 137  K 4.0 3.6 4.2  CL 101 101 104  CO2 26 23 27   GLUCOSE 112* 111* 97  BUN 14 12 14   CREATININE 0.78  0.63 0.69  CALCIUM 8.5* 8.3* 8.1*  MG 2.3  --  2.1  PHOS 3.5  --  4.1   GFR: Estimated Creatinine Clearance: 135.8 mL/min (by C-G formula based on SCr of 0.69 mg/dL). Liver Function Tests: Recent Labs  Lab 02/14/23 0122 02/15/23 0430 02/16/23 0446  AST 22 17 15   ALT 33 26 20  ALKPHOS 75 70 72  BILITOT 0.4 0.3 0.2  PROT 7.2 6.0* 6.4*  ALBUMIN 3.5 2.8* 2.6*   No results for input(s): "LIPASE", "AMYLASE" in the last 168 hours. No results for input(s): "AMMONIA" in the last 168 hours. Coagulation Profile: No results for input(s): "INR", "PROTIME" in the last 168 hours. Cardiac Enzymes: No results for input(s): "CKTOTAL", "CKMB", "CKMBINDEX", "TROPONINI" in the last 168 hours. BNP (last 3 results) No results for input(s): "PROBNP" in the last 8760 hours. HbA1C: No results for input(s): "HGBA1C" in the last 72 hours. CBG: No results for input(s): "GLUCAP" in the last 168 hours. Lipid Profile: No results for input(s): "CHOL", "HDL", "LDLCALC", "TRIG", "CHOLHDL", "LDLDIRECT" in the last 72 hours. Thyroid Function Tests: No results for input(s): "TSH", "T4TOTAL", "FREET4", "T3FREE", "THYROIDAB" in the last 72 hours. Anemia Panel: No results for input(s): "VITAMINB12", "FOLATE", "FERRITIN", "TIBC", "IRON", "RETICCTPCT" in the last 72 hours. Sepsis Labs: Recent Labs  Lab 02/14/23 0122  LATICACIDVEN 0.7    Recent Results (from the past 240 hours)  Blood culture (routine x 2)     Status: Abnormal   Collection Time: 02/14/23  1:58 AM   Specimen: BLOOD  Result Value Ref Range Status   Specimen Description   Final    BLOOD SITE NOT SPECIFIED Performed at [facility], [address], [place], [place] [address]    Special Requests   Final    BOTTLES DRAWN AEROBIC AND ANAEROBIC Blood Culture results may not be optimal due to an inadequate volume of blood received in culture bottles Performed at [facility] [address] [place],  [place] [address]    Culture  Setup Time   Final    GRAM POSITIVE COCCI IN CHAINS ANAEROBIC BOTTLE ONLY CRITICAL RESULT CALLED TO, READ BACK BY AND VERIFIED WITH: PHARMD CRYSTAL ROBERTSON [id number] AT 0819 BY EC    Culture (A)  Final    GROUP A STREP (S.PYOGENES) ISOLATED HEALTH DEPARTMENT NOTIFIED Performed at [facility], [address] [place], [place] [address]    Report Status 02/17/2023 FINAL  Final   Organism ID, Bacteria GROUP A STREP (S.PYOGENES) ISOLATED  Final      Susceptibility   Group a strep (s.pyogenes) isolated - MIC*    PENICILLIN <=0.06 SENSITIVE Sensitive     CEFTRIAXONE <=0.12 SENSITIVE Sensitive     ERYTHROMYCIN <=0.12 SENSITIVE Sensitive     LEVOFLOXACIN 2 SENSITIVE Sensitive     VANCOMYCIN 0.5 SENSITIVE Sensitive     * GROUP A STREP (S.PYOGENES) ISOLATED  Blood Culture ID Panel (Reflexed)     Status: Abnormal   Collection  Time: 02/14/23  1:58 AM  Result Value Ref Range Status   Enterococcus faecalis NOT DETECTED NOT DETECTED Final   Enterococcus Faecium NOT DETECTED NOT DETECTED Final   Listeria monocytogenes NOT DETECTED NOT DETECTED Final   Staphylococcus species NOT DETECTED NOT DETECTED Final   Staphylococcus aureus (BCID) NOT DETECTED NOT DETECTED Final   Staphylococcus epidermidis NOT DETECTED NOT DETECTED Final   Staphylococcus lugdunensis NOT DETECTED NOT DETECTED Final   Streptococcus species DETECTED (A) NOT DETECTED Final    Comment: CRITICAL RESULT CALLED TO, READ BACK BY AND VERIFIED WITH: PHARMD CRYSTAL ROBERTSON [id number] AT 0819 BY EC    Streptococcus agalactiae NOT DETECTED NOT DETECTED Final   Streptococcus pneumoniae NOT DETECTED NOT DETECTED Final   Streptococcus pyogenes DETECTED (A) NOT DETECTED Final    Comment: CRITICAL RESULT CALLED TO, READ BACK BY AND VERIFIED WITH: PHARMD CRYSTAL ROBERTSON [id number] AT 0819 BY EC    A.calcoaceticus-baumannii NOT DETECTED NOT DETECTED Final   Bacteroides fragilis NOT DETECTED NOT DETECTED Final    Enterobacterales NOT DETECTED NOT DETECTED Final   Enterobacter cloacae complex NOT DETECTED NOT DETECTED Final   Escherichia coli NOT DETECTED NOT DETECTED Final   Klebsiella aerogenes NOT DETECTED NOT DETECTED Final   Klebsiella oxytoca NOT DETECTED NOT DETECTED Final   Klebsiella pneumoniae NOT DETECTED NOT DETECTED Final   Proteus species NOT DETECTED NOT DETECTED Final   Salmonella species NOT DETECTED NOT DETECTED Final   Serratia marcescens NOT DETECTED NOT DETECTED Final   Haemophilus influenzae NOT DETECTED NOT DETECTED Final   Neisseria meningitidis NOT DETECTED NOT DETECTED Final   Pseudomonas aeruginosa NOT DETECTED NOT DETECTED Final   Stenotrophomonas maltophilia NOT DETECTED NOT DETECTED Final   Candida albicans NOT DETECTED NOT DETECTED Final   Candida auris NOT DETECTED NOT DETECTED Final   Candida glabrata NOT DETECTED NOT DETECTED Final   Candida krusei NOT DETECTED NOT DETECTED Final   Candida parapsilosis NOT DETECTED NOT DETECTED Final   Candida tropicalis NOT DETECTED NOT DETECTED Final   Cryptococcus neoformans/gattii NOT DETECTED NOT DETECTED Final    Comment: Performed at [facility], [address] [place], [place] [address]  Blood culture (routine x 2)     Status: None (Preliminary result)   Collection Time: 02/14/23  3:11 AM   Specimen: BLOOD  Result Value Ref Range Status   Specimen Description   Final    BLOOD SITE NOT SPECIFIED Performed at [facility], [address], [place], [place] [address]    Special Requests   Final    BOTTLES DRAWN AEROBIC AND ANAEROBIC Blood Culture adequate volume Performed at [facility], [address], [place], [place] [address]    Culture   Final    NO GROWTH 3 DAYS Performed at [facility], [address] [place], [place] [address]    Report Status PENDING  Incomplete  Culture, blood (Routine X 2) w Reflex to ID Panel     Status: None (Preliminary result)    Collection Time: 02/15/23 10:12 AM   Specimen: BLOOD LEFT ARM  Result Value Ref Range Status   Specimen Description   Final    BLOOD LEFT ARM Performed at [facility], [address] [place], [place] [address]    Special Requests   Final    BOTTLES DRAWN AEROBIC AND ANAEROBIC Blood Culture results may not be optimal due to an inadequate volume of blood received in culture bottles Performed at  [facility], [address], [place], [place] [address]    Culture   Final    NO GROWTH 2 DAYS Performed at [facility], [address], [place], [place] [address]    Report Status PENDING  Incomplete  Culture, blood (Routine X 2) w Reflex to ID Panel     Status: None (Preliminary result)   Collection Time: 02/15/23 10:17 AM   Specimen: BLOOD LEFT HAND  Result Value Ref Range Status   Specimen Description   Final    BLOOD LEFT HAND Performed at [facility], [address] [place], [place] [address]    Special Requests   Final    BOTTLES DRAWN AEROBIC ONLY Blood Culture results may not be optimal due to an inadequate volume of blood received in culture bottles Performed at [facility], [address], [place], [place] [address]    Culture   Final    NO GROWTH 2 DAYS Performed at [facility], [address], [place], [place] [address]    Report Status PENDING  Incomplete         Radiology Studies: ECHOCARDIOGRAM COMPLETE Result Date: 02/15/2023    ECHOCARDIOGRAM REPORT   Patient Name:   Thomas A Dilworth Date of Exam: 02/15/2023 Medical Rec #:  [id number]    Height:       71.0 in Accession #:    [id number]   Weight:       149.9 lb Date of Birth:  19-Jan-1996    BSA:          1.865 m Patient Age:    27 years     BP:           106/59 mmHg Patient Gender: M            HR:           66 bpm. Exam Location:  Inpatient Procedure: 2D Echo, Cardiac Doppler and Color Doppler Indications:    Bacteremia  History:        Patient has no  prior history of Echocardiogram examinations.  Sonographer:    Karma Ganja Referring Phys: [id number] Dorcas Carrow IMPRESSIONS  1. Left ventricular ejection fraction, by estimation, is 60 to 65%. The left ventricle has normal function. The left ventricle has no regional wall motion abnormalities. Left ventricular diastolic parameters were normal.  2. Right ventricular systolic function is normal. The right ventricular size is normal. Tricuspid regurgitation signal is inadequate for assessing PA pressure.  3. The mitral valve is normal in structure. Trivial mitral valve regurgitation. No evidence of mitral stenosis.  4. The aortic valve is tricuspid. Aortic valve regurgitation is not visualized.  5. The inferior vena cava is normal in size with greater than 50% respiratory variability, suggesting right atrial pressure of 3 mmHg. Comparison(s): No prior Echocardiogram. FINDINGS  Left Ventricle: Left ventricular ejection fraction, by estimation, is 60 to 65%. The left ventricle has normal function. The left ventricle has no regional wall motion abnormalities. The left ventricular internal cavity size was normal in size. There is  no left ventricular hypertrophy. Left ventricular diastolic parameters were normal. Right Ventricle: The right ventricular size is normal. Right ventricular systolic function is normal. Tricuspid regurgitation signal is inadequate for assessing PA pressure. The tricuspid regurgitant velocity is 1.90 m/s, and with an assumed right atrial  pressure of 3 mmHg, the estimated right ventricular systolic pressure is 17.4 mmHg. Left Atrium: Left atrial size was normal  in size. Right Atrium: Right atrial size was normal in size. Pericardium: There is no evidence of pericardial effusion. Mitral Valve: The mitral valve is normal in structure. Trivial mitral valve regurgitation. No evidence of mitral valve stenosis. Tricuspid Valve: The tricuspid valve is normal in structure. Tricuspid valve regurgitation  is trivial. No evidence of tricuspid stenosis. Aortic Valve: The aortic valve is tricuspid. Aortic valve regurgitation is not visualized. Aortic valve mean gradient measures 3.0 mmHg. Aortic valve peak gradient measures 6.6 mmHg. Aortic valve area, by VTI measures 3.09 cm. Pulmonic Valve: The pulmonic valve was normal in structure. Pulmonic valve regurgitation is not visualized. No evidence of pulmonic stenosis. Aorta: The aortic root is normal in size and structure. Venous: The inferior vena cava is normal in size with greater than 50% respiratory variability, suggesting right atrial pressure of 3 mmHg. IAS/Shunts: No atrial level shunt detected by color flow Doppler.  LEFT VENTRICLE PLAX 2D LVIDd:         5.10 cm   Diastology LVIDs:         3.20 cm   LV e' medial:    15.40 cm/s LV PW:         0.70 cm   LV E/e' medial:  5.1 LV IVS:        0.70 cm   LV e' lateral:   14.70 cm/s LVOT diam:     2.20 cm   LV E/e' lateral: 5.4 LV SV:         72 LV SV Index:   39 LVOT Area:     3.80 cm  RIGHT VENTRICLE             IVC RV Basal diam:  4.00 cm     IVC diam: 2.00 cm RV S prime:     13.90 cm/s TAPSE (M-mode): 3.6 cm LEFT ATRIUM             Index        RIGHT ATRIUM           Index LA diam:        3.20 cm 1.72 cm/m   RA Area:     18.30 cm LA Vol (A2C):   40.9 ml 21.93 ml/m  RA Volume:   54.50 ml  29.22 ml/m LA Vol (A4C):   24.5 ml 13.13 ml/m LA Biplane Vol: 32.4 ml 17.37 ml/m  AORTIC VALVE AV Area (Vmax):    3.03 cm AV Area (Vmean):   3.02 cm AV Area (VTI):     3.09 cm AV Vmax:           128.00 cm/s AV Vmean:          80.700 cm/s AV VTI:            0.234 m AV Peak Grad:      6.6 mmHg AV Mean Grad:      3.0 mmHg LVOT Vmax:         102.00 cm/s LVOT Vmean:        64.100 cm/s LVOT VTI:          0.190 m LVOT/AV VTI ratio: 0.81  AORTA Ao Root diam: 3.20 cm MITRAL VALVE               TRICUSPID VALVE MV Area (PHT): 3.77 cm    TR Peak grad:   14.4 mmHg MV Decel Time: 201 msec    TR Vmax:        190.00 cm/s MV E velocity:  78.80 cm/s MV A velocity: 42.40 cm/s  SHUNTS MV E/A ratio:  1.86        Systemic VTI:  0.19 m                            Systemic Diam: 2.20 cm Olga Millers MD Electronically signed by Olga Millers MD Signature Date/Time: 02/15/2023/2:40:40 PM    Final         Scheduled Meds:  buprenorphine-naloxone  1 tablet Sublingual BID   enoxaparin (LOVENOX) injection  40 mg Subcutaneous Q24H   feeding supplement  237 mL Oral BID BM   linezolid  600 mg Oral Q12H   nicotine  21 mg Transdermal Daily   Continuous Infusions:   ceFAZolin (ANCEF) IV 2 g (02/17/23 0245)     LOS: 3 days    Time spent: 35 minutes    Dorcas Carrow, MD [facility]

## 2023-02-18 ENCOUNTER — Encounter (HOSPITAL_COMMUNITY)
Admission: EM | Discharge status: Against Medical Advice | Payer: Self-pay | Source: Home / Self Care | Attending: Internal Medicine

## 2023-02-18 ENCOUNTER — Inpatient Hospital Stay (HOSPITAL_COMMUNITY): Payer: 59 | Admitting: Anesthesiology

## 2023-02-18 ENCOUNTER — Encounter (HOSPITAL_COMMUNITY): Payer: Self-pay

## 2023-02-18 DIAGNOSIS — L02413 Cutaneous abscess of right upper limb: Secondary | ICD-10-CM | POA: Diagnosis not present

## 2023-02-18 DIAGNOSIS — L03114 Cellulitis of left upper limb: Secondary | ICD-10-CM | POA: Diagnosis not present

## 2023-02-18 HISTORY — PX: INCISION AND DRAINAGE ABSCESS: SHX5864

## 2023-02-18 LAB — HEPATITIS C ANTIBODY: HCV Ab: NONREACTIVE

## 2023-02-18 LAB — HEPATITIS B CORE ANTIBODY, TOTAL: Hep B Core Total Ab: NONREACTIVE

## 2023-02-18 LAB — HEPATITIS B SURFACE ANTIGEN: Hepatitis B Surface Ag: NONREACTIVE

## 2023-02-18 SURGERY — INCISION AND DRAINAGE, ABSCESS
Anesthesia: General | Laterality: Right

## 2023-02-18 MED ORDER — HYDROMORPHONE HCL 2 MG/ML IJ SOLN
INTRAMUSCULAR | Status: AC
  Filled 2023-02-18: qty 1

## 2023-02-18 MED ORDER — KETAMINE HCL 50 MG/5ML IJ SOSY
PREFILLED_SYRINGE | INTRAMUSCULAR | Status: AC
  Filled 2023-02-18: qty 5

## 2023-02-18 MED ORDER — MIDAZOLAM HCL 2 MG/2ML IJ SOLN
INTRAMUSCULAR | Status: AC
Start: 2023-02-18 — End: ?
  Filled 2023-02-18: qty 2

## 2023-02-18 MED ORDER — PENICILLIN G POTASSIUM 20000000 UNITS IJ SOLR
12.0000 10*6.[IU] | Freq: Two times a day (BID) | INTRAVENOUS | Status: DC
  Filled 2023-02-18: qty 12

## 2023-02-18 MED ORDER — HYDROMORPHONE HCL 1 MG/ML IJ SOLN
1.0000 mg | Freq: Once | INTRAMUSCULAR | Status: AC
  Administered 2023-02-18: 1 mg via INTRAVENOUS

## 2023-02-18 MED ORDER — DEXAMETHASONE SODIUM PHOSPHATE 4 MG/ML IJ SOLN
INTRAMUSCULAR | Status: DC | PRN
  Administered 2023-02-18: 4 mg via INTRAVENOUS

## 2023-02-18 MED ORDER — PROPOFOL 10 MG/ML IV BOLUS
INTRAVENOUS | Status: DC | PRN
  Administered 2023-02-18: 50 mg via INTRAVENOUS
  Administered 2023-02-18: 100 mg via INTRAVENOUS
  Administered 2023-02-18: 50 mg via INTRAVENOUS
  Administered 2023-02-18: 200 mg via INTRAVENOUS

## 2023-02-18 MED ORDER — DROPERIDOL 2.5 MG/ML IJ SOLN
0.6250 mg | Freq: Once | INTRAMUSCULAR | Status: DC | PRN
Start: 2023-02-18 — End: 2023-02-18

## 2023-02-18 MED ORDER — PROPOFOL 500 MG/50ML IV EMUL
INTRAVENOUS | Status: AC
  Filled 2023-02-18: qty 50

## 2023-02-18 MED ORDER — HYDROMORPHONE HCL 1 MG/ML IJ SOLN
0.2500 mg | INTRAMUSCULAR | Status: DC | PRN
  Administered 2023-02-18 (×2): 0.5 mg via INTRAVENOUS

## 2023-02-18 MED ORDER — HYDROMORPHONE HCL 1 MG/ML IJ SOLN
INTRAMUSCULAR | Status: AC
  Administered 2023-02-18: 0.5 mg via INTRAVENOUS
  Filled 2023-02-18: qty 1

## 2023-02-18 MED ORDER — CHLORHEXIDINE GLUCONATE 0.12 % MT SOLN
15.0000 mL | Freq: Once | OROMUCOSAL | Status: AC
  Administered 2023-02-18: 15 mL via OROMUCOSAL

## 2023-02-18 MED ORDER — ACETAMINOPHEN 10 MG/ML IV SOLN
INTRAVENOUS | Status: AC
  Filled 2023-02-18: qty 100

## 2023-02-18 MED ORDER — SODIUM CHLORIDE 0.9 % IV SOLN
12.0000 10*6.[IU] | Freq: Two times a day (BID) | INTRAVENOUS | Status: DC
  Administered 2023-02-19: 12 10*6.[IU] via INTRAVENOUS
  Filled 2023-02-18 (×4): qty 12

## 2023-02-18 MED ORDER — FENTANYL CITRATE PF 50 MCG/ML IJ SOSY
PREFILLED_SYRINGE | INTRAMUSCULAR | Status: AC
  Administered 2023-02-18: 50 ug via INTRAVENOUS
  Filled 2023-02-18: qty 2

## 2023-02-18 MED ORDER — FENTANYL CITRATE (PF) 100 MCG/2ML IJ SOLN
INTRAMUSCULAR | Status: DC | PRN
  Administered 2023-02-18 (×2): 50 ug via INTRAVENOUS

## 2023-02-18 MED ORDER — FENTANYL CITRATE PF 50 MCG/ML IJ SOSY
25.0000 ug | PREFILLED_SYRINGE | INTRAMUSCULAR | Status: DC | PRN
Start: 2023-02-18 — End: 2023-02-18
  Administered 2023-02-18 (×2): 50 ug via INTRAVENOUS

## 2023-02-18 MED ORDER — FENTANYL CITRATE (PF) 100 MCG/2ML IJ SOLN
INTRAMUSCULAR | Status: AC
  Filled 2023-02-18: qty 2

## 2023-02-18 MED ORDER — LACTATED RINGERS IV SOLN: INTRAVENOUS | Status: DC

## 2023-02-18 MED ORDER — OXYCODONE HCL 5 MG/5ML PO SOLN: 5.0000 mg | Freq: Once | ORAL | Status: AC | PRN

## 2023-02-18 MED ORDER — ACETAMINOPHEN 10 MG/ML IV SOLN
1000.0000 mg | Freq: Once | INTRAVENOUS | Status: DC | PRN
  Administered 2023-02-18: 1000 mg via INTRAVENOUS

## 2023-02-18 MED ORDER — DEXMEDETOMIDINE HCL IN NACL 80 MCG/20ML IV SOLN
INTRAVENOUS | Status: DC | PRN
  Administered 2023-02-18 (×3): 4 ug via INTRAVENOUS

## 2023-02-18 MED ORDER — ONDANSETRON HCL 4 MG/2ML IJ SOLN
INTRAMUSCULAR | Status: DC | PRN
  Administered 2023-02-18: 4 mg via INTRAVENOUS

## 2023-02-18 MED ORDER — MIDAZOLAM HCL 5 MG/5ML IJ SOLN
INTRAMUSCULAR | Status: DC | PRN
  Administered 2023-02-18: 2 mg via INTRAVENOUS

## 2023-02-18 MED ORDER — OXYCODONE HCL 5 MG PO TABS
5.0000 mg | ORAL_TABLET | Freq: Once | ORAL | Status: AC | PRN
  Administered 2023-02-18: 5 mg via ORAL

## 2023-02-18 MED ORDER — 0.9 % SODIUM CHLORIDE (POUR BTL) OPTIME
TOPICAL | Status: DC | PRN
  Administered 2023-02-18: 1000 mL

## 2023-02-18 MED ORDER — HYDROMORPHONE HCL 1 MG/ML IJ SOLN
INTRAMUSCULAR | Status: DC | PRN
  Administered 2023-02-18 (×2): .5 mg via INTRAVENOUS

## 2023-02-18 MED ORDER — BUPIVACAINE HCL 0.25 % IJ SOLN
INTRAMUSCULAR | Status: DC | PRN
  Administered 2023-02-18: 50 mL

## 2023-02-18 MED ORDER — BUPIVACAINE HCL 0.25 % IJ SOLN
INTRAMUSCULAR | Status: AC
  Filled 2023-02-18: qty 1

## 2023-02-18 MED ORDER — KETAMINE HCL 10 MG/ML IJ SOLN
INTRAMUSCULAR | Status: DC | PRN
  Administered 2023-02-18: 30 mg via INTRAVENOUS
  Administered 2023-02-18: 20 mg via INTRAVENOUS

## 2023-02-18 MED ORDER — LIDOCAINE HCL (CARDIAC) PF 100 MG/5ML IV SOSY
PREFILLED_SYRINGE | INTRAVENOUS | Status: DC | PRN
  Administered 2023-02-18: 100 mg via INTRAVENOUS

## 2023-02-18 MED ORDER — FENTANYL CITRATE PF 50 MCG/ML IJ SOSY
PREFILLED_SYRINGE | INTRAMUSCULAR | Status: AC
  Filled 2023-02-18: qty 1

## 2023-02-18 MED ORDER — OXYCODONE HCL 5 MG PO TABS
ORAL_TABLET | ORAL | Status: AC
  Filled 2023-02-18: qty 1

## 2023-02-18 SURGICAL SUPPLY — 38 items
BAG COUNTER SPONGE SURGICOUNT (BAG) ×2 IMPLANT
BAG ZIPLOCK 12X15 (MISCELLANEOUS) ×2 IMPLANT
BLADE SURG 15 STRL LF DISP TIS (BLADE) ×4 IMPLANT
BNDG ELASTIC 6INX 5YD STR LF (GAUZE/BANDAGES/DRESSINGS) ×2 IMPLANT
BNDG GAUZE DERMACEA FLUFF 4 (GAUZE/BANDAGES/DRESSINGS) ×2 IMPLANT
CHLORAPREP W/TINT 26 (MISCELLANEOUS) ×4 IMPLANT
CNTNR URN SCR LID CUP LEK RST (MISCELLANEOUS) ×6 IMPLANT
COVER SURGICAL LIGHT HANDLE (MISCELLANEOUS) ×2 IMPLANT
CUFF TRNQT CYL 34X4.125X (TOURNIQUET CUFF) ×2 IMPLANT
DRAIN PENROSE 18X1/4 LTX STRL (DRAIN) IMPLANT
DRAPE SHEET LG 3/4 BI-LAMINATE (DRAPES) ×2 IMPLANT
DRSG ADAPTIC 3X8 NADH LF (GAUZE/BANDAGES/DRESSINGS) IMPLANT
GAUZE PAD ABD 8X10 STRL (GAUZE/BANDAGES/DRESSINGS) IMPLANT
GAUZE SPONGE 2X2 8PLY STRL LF (GAUZE/BANDAGES/DRESSINGS) IMPLANT
GAUZE SPONGE 4X4 12PLY STRL (GAUZE/BANDAGES/DRESSINGS) IMPLANT
GLOVE BIO SURGEON STRL SZ7.5 (GLOVE) ×2 IMPLANT
GLOVE BIOGEL PI IND STRL 7.5 (GLOVE) ×2 IMPLANT
GOWN STRL REUS W/ TWL LRG LVL3 (GOWN DISPOSABLE) ×2 IMPLANT
JET LAVAGE IRRISEPT WOUND (IRRIGATION / IRRIGATOR) ×1 IMPLANT
LAVAGE JET IRRISEPT WOUND (IRRIGATION / IRRIGATOR) ×2 IMPLANT
MANIFOLD NEPTUNE II (INSTRUMENTS) ×2 IMPLANT
PACK ORTHO EXTREMITY (CUSTOM PROCEDURE TRAY) ×2 IMPLANT
PADDING CAST ABS COTTON 6X4 NS (CAST SUPPLIES) IMPLANT
PROTECTOR NERVE ULNAR (MISCELLANEOUS) ×2 IMPLANT
SET CYSTO W/LG BORE CLAMP LF (SET/KITS/TRAYS/PACK) ×2 IMPLANT
SET HNDPC FAN SPRY TIP SCT (DISPOSABLE) ×2 IMPLANT
SOLUTION IRRIG SURGIPHOR (IV SOLUTION) ×2 IMPLANT
SPONGE T-LAP 18X18 ~~LOC~~+RFID (SPONGE) ×2 IMPLANT
SPONGE T-LAP 4X18 ~~LOC~~+RFID (SPONGE) ×2 IMPLANT
SUT ETHILON 2 0 FS 18 (SUTURE) IMPLANT
SUT ETHILON 2 0 PSLX (SUTURE) IMPLANT
SUT VIC AB 1 CT1 36 (SUTURE) ×4 IMPLANT
SUT VIC AB 2-0 CT1 TAPERPNT 27 (SUTURE) ×6 IMPLANT
SWAB COLLECTION DEVICE MRSA (MISCELLANEOUS) IMPLANT
SWAB CULTURE ESWAB REG 1ML (MISCELLANEOUS) IMPLANT
TRAY PREMIUM WET SKIN SCRUB (MISCELLANEOUS) ×2 IMPLANT
TUBING CONNECTING 10 (TUBING) ×2 IMPLANT
YANKAUER SUCT BULB TIP NO VENT (SUCTIONS) ×2 IMPLANT

## 2023-02-18 NOTE — Plan of Care (Signed)

## 2023-02-18 NOTE — Transfer of Care (Signed)
Immediate Anesthesia Transfer of Care Note  Patient: Thomas Walter  Procedure(s) Performed: INCISION AND DRAINAGE ABSCESS RIGHT ARM ABSCESS (Right)  Patient Location: PACU  Anesthesia Type:General  Level of Consciousness: awake and drowsy  Airway & Oxygen Therapy: Patient Spontanous Breathing and Patient connected to face mask oxygen  Post-op Assessment: Report given to RN and Post -op Vital signs reviewed and stable  Post vital signs: Reviewed and stable  Last Vitals:  Vitals Value Taken Time  BP 120/91 02/18/23 1845  Temp    Pulse 70 02/18/23 1845  Resp 22 02/18/23 1845  SpO2 100 % 02/18/23 1845  Vitals shown include unfiled device data.  Last Pain:  Vitals:   02/18/23 1720  TempSrc:   PainSc: Asleep      Patients Stated Pain Goal: 0 (02/18/23 1204)  Complications: No notable events documented.

## 2023-02-18 NOTE — Progress Notes (Signed)
Dr. Charlynn Grimes to bedside to discuss pain management in PACU. MD states that due to IV drug use, pain medications given in PACU may not allow him to reach his goal of pain 3/10. Per MD patient has received 150 mcg of Fentanyl, 2 mg Hydromorphone, 5 mg Oxycodone, and 1,000 mg of Tylenol. No further pain mediation ordered by MD. Patient appears to be comfortable  in bed and has eaten 1 sandwich, 3 packs, of crackers, and had 480 mL of ginger ale in PACU.

## 2023-02-18 NOTE — Progress Notes (Addendum)
PHARMACY NOTE -  Cefazolin   Pharmacy has been assisting with dosing of cefazolin for strep pyogenes bacteremia, LUE cellulitis, R elbow abscess. Dosage remains stable at 2g IV q8 hr and further renal adjustments per institutional Pharmacy antibiotic protocol   Pharmacy will sign off, following peripherally for culture results, dose adjustments, and length of therapy. Please reconsult if a change in clinical status warrants re-evaluation of dosage.    Cherylin Mylar, PharmD Clinical Pharmacist  12/30/20247:44 AM

## 2023-02-18 NOTE — Progress Notes (Signed)
PROGRESS NOTE    Thomas Walter  XLK:[id number] DOB: 09-07-1995 DOA: 02/14/2023 PCP: Ronnald Nian, MD    Brief Narrative:  27 year old with history of ADD, IV heroin use presented with 3-days history of left upper extremity erythema, edema, pain as well as right elbow pain.  In the emergency room white cell count 12.1, hemoglobin 11.4.  X-ray showing soft tissue swelling.  No foreign body. Blood cultures growing Streptococcus. Patient was found injecting heroin in the hospital, again snorting heroin in the hospital.  No visitors.  Now remains with bedside sitter.  Subjective:  Patient seen and examined.  Denies any complaints.  Pain is controlled. Fortunately, left hand swelling has greatly improved. Right elbow cellulitis and surrounding induration has improved, now he has palpable fluctuation medial right elbow. I have asked Dr. Hulda Humphrey to formally consult on the patient.  Patient will need I&D of the right medial elbow abscess.   Assessment & Plan:   Streptococcal bacteremia: Diffuse soft tissue infection, cellulitis left dorsum of the hand, cellulitis right elbow secondary to heroin injection. -Blood cultures positive for Streptococcus.  On cefazolin and Zyvox.  ID following. -Repeat blood cultures negative so far.  2D echo without any evidence of vegetation. - CT scan with contrast left hand with diffuse cellulitis.  Clinically improving.  -CT scan of the right elbow with cellulitis and induration, 4 x 3 cm abscess.  Surrounding cellulitis has improved and now he has fluctuant and tense abscess that needs I&D. Case discussed with Dr. Hulda Humphrey, Lala Lund and consult sent.  Heroin abuse: Ongoing active use of heroin.  Previously on Suboxone, resumed.  Toradol for pain control. Suspected using heroin in the hospital room. No visitors allowed. Patient does have painful condition, using Dilaudid and tramadol, Toradol for acute pain.  Smoker: Nicotine patch.   DVT prophylaxis:  enoxaparin (LOVENOX) injection 40 mg Start: 02/14/23 2200   Code Status: Full code Family Communication: None at the bedside Disposition Plan: Status is: Inpatient Remains inpatient appropriate because: Bacteremia, IV antibiotics     Consultants:  Infectious disease,   Procedures:  None  Antimicrobials:  Vancomycin 12/26-12/27 Cefazolin 12/27-- Zyvox 12/27--     Objective: Vitals:   02/17/23 1157 02/17/23 1758 02/17/23 1952 02/18/23 0505  BP: 101/60 111/66 (!) 113/56 119/65  Pulse: 69 67 76 (!) 51  Resp: 15 16 18 18   Temp: 98.4 F (36.9 C) 98.4 F (36.9 C) 98.8 F (37.1 C) (!) 97.5 F (36.4 C)  TempSrc: Oral Oral Oral Oral  SpO2: 99% 100% 98% 98%  Weight:      Height:        Intake/Output Summary (Last 24 hours) at 02/18/2023 1120 Last data filed at 02/18/2023 0600 Gross per 24 hour  Intake 630 ml  Output --  Net 630 ml   Filed Weights   02/14/23 0200  Weight: 68 kg    Examination:  General exam: Not in any distress.  More interactive today. Respiratory system: Clear to auscultation. Respiratory effort normal. Cardiovascular system: S1 & S2 heard, RRR.  Gastrointestinal system: Abdomen is nondistended, soft and nontender. No organomegaly or masses felt. Normal bowel sounds heard. Central nervous system: Alert and oriented. No focal neurological deficits.   Extremities: Symmetric 5 x 5 power. Skin:  Mild erythema dorsum of the left hand.   Right elbow erythema and induration receding margins, fluctuant swelling.        Data Reviewed: I have personally reviewed following labs and imaging studies  CBC: Recent  Labs  Lab 02/14/23 0155 02/15/23 0430 02/16/23 0446  WBC 12.1* 10.5 10.1  NEUTROABS 9.7*  --  7.2  HGB 11.4* 11.2* 11.5*  HCT 33.4* 34.3* 36.2*  MCV 89.3 91.7 92.3  PLT 225 191 221   Basic Metabolic Panel: Recent Labs  Lab 02/14/23 0122 02/15/23 0430 02/16/23 0446  NA 132* 134* 137  K 4.0 3.6 4.2  CL 101 101 104  CO2 26  23 27   GLUCOSE 112* 111* 97  BUN 14 12 14   CREATININE 0.78 0.63 0.69  CALCIUM 8.5* 8.3* 8.1*  MG 2.3  --  2.1  PHOS 3.5  --  4.1   GFR: Estimated Creatinine Clearance: 135.8 mL/min (by C-G formula based on SCr of 0.69 mg/dL). Liver Function Tests: Recent Labs  Lab 02/14/23 0122 02/15/23 0430 02/16/23 0446  AST 22 17 15   ALT 33 26 20  ALKPHOS 75 70 72  BILITOT 0.4 0.3 0.2  PROT 7.2 6.0* 6.4*  ALBUMIN 3.5 2.8* 2.6*   No results for input(s): "LIPASE", "AMYLASE" in the last 168 hours. No results for input(s): "AMMONIA" in the last 168 hours. Coagulation Profile: No results for input(s): "INR", "PROTIME" in the last 168 hours. Cardiac Enzymes: No results for input(s): "CKTOTAL", "CKMB", "CKMBINDEX", "TROPONINI" in the last 168 hours. BNP (last 3 results) No results for input(s): "PROBNP" in the last 8760 hours. HbA1C: No results for input(s): "HGBA1C" in the last 72 hours. CBG: No results for input(s): "GLUCAP" in the last 168 hours. Lipid Profile: No results for input(s): "CHOL", "HDL", "LDLCALC", "TRIG", "CHOLHDL", "LDLDIRECT" in the last 72 hours. Thyroid Function Tests: No results for input(s): "TSH", "T4TOTAL", "FREET4", "T3FREE", "THYROIDAB" in the last 72 hours. Anemia Panel: No results for input(s): "VITAMINB12", "FOLATE", "FERRITIN", "TIBC", "IRON", "RETICCTPCT" in the last 72 hours. Sepsis Labs: Recent Labs  Lab 02/14/23 0122  LATICACIDVEN 0.7    Recent Results (from the past 240 hours)  Blood culture (routine x 2)     Status: Abnormal   Collection Time: 02/14/23  1:58 AM   Specimen: BLOOD  Result Value Ref Range Status   Specimen Description   Final    BLOOD SITE NOT SPECIFIED Performed at [facility], [address] [place], [place] [address]    Special Requests   Final    BOTTLES DRAWN AEROBIC AND ANAEROBIC Blood Culture results may not be optimal due to an inadequate volume of blood received in culture bottles Performed at  [facility], [address] [place], [place] [address]    Culture  Setup Time   Final    GRAM POSITIVE COCCI IN CHAINS ANAEROBIC BOTTLE ONLY CRITICAL RESULT CALLED TO, READ BACK BY AND VERIFIED WITH: PHARMD CRYSTAL ROBERTSON [phone] AT 0819 BY EC    Culture (A)  Final    GROUP A STREP (S.PYOGENES) ISOLATED HEALTH DEPARTMENT NOTIFIED Performed at [facility], [address] [place], [place] [address]    Report Status 02/17/2023 FINAL  Final   Organism ID, Bacteria GROUP A STREP (S.PYOGENES) ISOLATED  Final      Susceptibility   Group a strep (s.pyogenes) isolated - MIC*    PENICILLIN <=0.06 SENSITIVE Sensitive     CEFTRIAXONE <=0.12 SENSITIVE Sensitive     ERYTHROMYCIN <=0.12 SENSITIVE Sensitive     LEVOFLOXACIN 2 SENSITIVE Sensitive     VANCOMYCIN 0.5 SENSITIVE Sensitive     * GROUP A STREP (S.PYOGENES) ISOLATED  Blood Culture ID Panel (Reflexed)  Status: Abnormal   Collection Time: 02/14/23  1:58 AM  Result Value Ref Range Status   Enterococcus faecalis NOT DETECTED NOT DETECTED Final   Enterococcus Faecium NOT DETECTED NOT DETECTED Final   Listeria monocytogenes NOT DETECTED NOT DETECTED Final   Staphylococcus species NOT DETECTED NOT DETECTED Final   Staphylococcus aureus (BCID) NOT DETECTED NOT DETECTED Final   Staphylococcus epidermidis NOT DETECTED NOT DETECTED Final   Staphylococcus lugdunensis NOT DETECTED NOT DETECTED Final   Streptococcus species DETECTED (A) NOT DETECTED Final    Comment: CRITICAL RESULT CALLED TO, READ BACK BY AND VERIFIED WITH: PHARMD CRYSTAL ROBERTSON [id number] AT 0819 BY EC    Streptococcus agalactiae NOT DETECTED NOT DETECTED Final   Streptococcus pneumoniae NOT DETECTED NOT DETECTED Final   Streptococcus pyogenes DETECTED (A) NOT DETECTED Final    Comment: CRITICAL RESULT CALLED TO, READ BACK BY AND VERIFIED WITH: PHARMD CRYSTAL ROBERTSON [id number] AT 0819 BY EC    A.calcoaceticus-baumannii NOT DETECTED NOT  DETECTED Final   Bacteroides fragilis NOT DETECTED NOT DETECTED Final   Enterobacterales NOT DETECTED NOT DETECTED Final   Enterobacter cloacae complex NOT DETECTED NOT DETECTED Final   Escherichia coli NOT DETECTED NOT DETECTED Final   Klebsiella aerogenes NOT DETECTED NOT DETECTED Final   Klebsiella oxytoca NOT DETECTED NOT DETECTED Final   Klebsiella pneumoniae NOT DETECTED NOT DETECTED Final   Proteus species NOT DETECTED NOT DETECTED Final   Salmonella species NOT DETECTED NOT DETECTED Final   Serratia marcescens NOT DETECTED NOT DETECTED Final   Haemophilus influenzae NOT DETECTED NOT DETECTED Final   Neisseria meningitidis NOT DETECTED NOT DETECTED Final   Pseudomonas aeruginosa NOT DETECTED NOT DETECTED Final   Stenotrophomonas maltophilia NOT DETECTED NOT DETECTED Final   Candida albicans NOT DETECTED NOT DETECTED Final   Candida auris NOT DETECTED NOT DETECTED Final   Candida glabrata NOT DETECTED NOT DETECTED Final   Candida krusei NOT DETECTED NOT DETECTED Final   Candida parapsilosis NOT DETECTED NOT DETECTED Final   Candida tropicalis NOT DETECTED NOT DETECTED Final   Cryptococcus neoformans/gattii NOT DETECTED NOT DETECTED Final    Comment: Performed at [facility], [address], [place], [place] [address]  Blood culture (routine x 2)     Status: None (Preliminary result)   Collection Time: 02/14/23  3:11 AM   Specimen: BLOOD  Result Value Ref Range Status   Specimen Description   Final    BLOOD SITE NOT SPECIFIED Performed at [facility], [address], [place], [place] [address]    Special Requests   Final    BOTTLES DRAWN AEROBIC AND ANAEROBIC Blood Culture adequate volume Performed at [facility], [address] [place], [place] [address]    Culture   Final    NO GROWTH 3 DAYS Performed at [facility], [address], [place], [place] [address]    Report Status PENDING  Incomplete  Culture, blood  (Routine X 2) w Reflex to ID Panel     Status: None (Preliminary result)   Collection Time: 02/15/23 10:12 AM   Specimen: BLOOD LEFT ARM  Result Value Ref Range Status   Specimen Description   Final    BLOOD LEFT ARM Performed at [facility], [address], [place], [place] [address]    Special Requests   Final    BOTTLES DRAWN AEROBIC AND ANAEROBIC Blood Culture results may not be optimal due to an inadequate volume of blood received  in culture bottles Performed at [facility], [address] [place], [place] [address]    Culture   Final    NO GROWTH 2 DAYS Performed at [facility], [address], [place], [place] [address]    Report Status PENDING  Incomplete  Culture, blood (Routine X 2) w Reflex to ID Panel     Status: None (Preliminary result)   Collection Time: 02/15/23 10:17 AM   Specimen: BLOOD LEFT HAND  Result Value Ref Range Status   Specimen Description   Final    BLOOD LEFT HAND Performed at [facility], [address] [place], [place] [address]    Special Requests   Final    BOTTLES DRAWN AEROBIC ONLY Blood Culture results may not be optimal due to an inadequate volume of blood received in culture bottles Performed at [facility], [address] [place], [place] [address]    Culture   Final    NO GROWTH 2 DAYS Performed at [facility], [address], [place], [place] [address]    Report Status PENDING  Incomplete         Radiology Studies: No results found.       Scheduled Meds:  buprenorphine-naloxone  1 tablet Sublingual BID   enoxaparin (LOVENOX) injection  40 mg Subcutaneous Q24H   feeding supplement  237 mL Oral BID BM   linezolid  600 mg Oral Q12H   nicotine  21 mg Transdermal Daily   Continuous Infusions:   ceFAZolin (ANCEF) IV 2 g (02/18/23 0346)     LOS: 4 days    Time spent: 35 minutes    Dorcas Carrow, MD [facility]

## 2023-02-18 NOTE — Progress Notes (Signed)
[facility]   Reason for visit: Follow up on abcscess  Interval History: WBC 10.1, blood cultures 12/27 ngtd, 12/26 culture with GAS in one set out of two.   On cefazolin and linezolid  Physical Exam: Constitutional:  Vitals:   02/18/23 0505 02/18/23 1159  BP: 119/65 (!) 97/56  Pulse: (!) 51 67  Resp: 18 18  Temp: (!) 97.5 F (36.4 C) 98.3 F (36.8 C)  SpO2: 98% 99%   patient appears in NAD Respiratory: Normal respiratory effort MS: right arm with fluctuant area around antecubital area  Review of Systems: Constitutional: negative for fevers and chills  Lab Results  Component Value Date   WBC 10.1 02/16/2023   HGB 11.5 (L) 02/16/2023   HCT 36.2 (L) 02/16/2023   MCV 92.3 02/16/2023   PLT 221 02/16/2023    Lab Results  Component Value Date   CREATININE 0.69 02/16/2023   BUN 14 02/16/2023   NA 137 02/16/2023   K 4.2 02/16/2023   CL 104 02/16/2023   CO2 27 02/16/2023    Lab Results  Component Value Date   ALT 20 02/16/2023   AST 15 02/16/2023   ALKPHOS 72 02/16/2023     Microbiology: Recent Results (from the past 240 hours)  Blood culture (routine x 2)     Status: Abnormal   Collection Time: 02/14/23  1:58 AM   Specimen: BLOOD  Result Value Ref Range Status   Specimen Description   Final    BLOOD SITE NOT SPECIFIED Performed at [facility], [address] [place], [place] [address]    Special Requests   Final    BOTTLES DRAWN AEROBIC AND ANAEROBIC Blood Culture results may not be optimal due to an inadequate volume of blood received in culture bottles Performed at [facility], [address], [place], [place] [address]    Culture  Setup Time   Final    GRAM POSITIVE COCCI IN CHAINS ANAEROBIC BOTTLE ONLY CRITICAL RESULT CALLED TO, READ BACK BY AND VERIFIED WITH: PHARMD CRYSTAL ROBERTSON [phone] AT 0819 BY EC    Culture (A)  Final    GROUP A STREP (S.PYOGENES) ISOLATED HEALTH  DEPARTMENT NOTIFIED Performed at [facility], [address], [place], [place] [address]    Report Status 02/17/2023 FINAL  Final   Organism ID, Bacteria GROUP A STREP (S.PYOGENES) ISOLATED  Final      Susceptibility   Group a strep (s.pyogenes) isolated - MIC*    PENICILLIN <=0.06 SENSITIVE Sensitive     CEFTRIAXONE <=0.12 SENSITIVE Sensitive     ERYTHROMYCIN <=0.12 SENSITIVE Sensitive     LEVOFLOXACIN 2 SENSITIVE Sensitive     VANCOMYCIN 0.5 SENSITIVE Sensitive     * GROUP A STREP (S.PYOGENES) ISOLATED  Blood Culture ID Panel (Reflexed)     Status: Abnormal   Collection Time: 02/14/23  1:58 AM  Result Value Ref Range Status   Enterococcus faecalis NOT DETECTED NOT DETECTED Final   Enterococcus Faecium NOT DETECTED NOT DETECTED Final   Listeria monocytogenes NOT DETECTED NOT DETECTED Final   Staphylococcus species NOT DETECTED NOT DETECTED Final   Staphylococcus aureus (BCID) NOT DETECTED NOT DETECTED Final   Staphylococcus epidermidis NOT DETECTED NOT DETECTED Final   Staphylococcus lugdunensis NOT DETECTED NOT DETECTED Final   Streptococcus species DETECTED (A) NOT DETECTED Final    Comment: CRITICAL RESULT CALLED TO, READ BACK BY AND VERIFIED WITH: PHARMD CRYSTAL ROBERTSON [id number] AT 0819 BY  EC    Streptococcus agalactiae NOT DETECTED NOT DETECTED Final   Streptococcus pneumoniae NOT DETECTED NOT DETECTED Final   Streptococcus pyogenes DETECTED (A) NOT DETECTED Final    Comment: CRITICAL RESULT CALLED TO, READ BACK BY AND VERIFIED WITH: PHARMD CRYSTAL ROBERTSON [phone] AT 0819 BY EC    A.calcoaceticus-baumannii NOT DETECTED NOT DETECTED Final   Bacteroides fragilis NOT DETECTED NOT DETECTED Final   Enterobacterales NOT DETECTED NOT DETECTED Final   Enterobacter cloacae complex NOT DETECTED NOT DETECTED Final   Escherichia coli NOT DETECTED NOT DETECTED Final   Klebsiella aerogenes NOT DETECTED NOT DETECTED Final   Klebsiella oxytoca NOT DETECTED NOT DETECTED Final    Klebsiella pneumoniae NOT DETECTED NOT DETECTED Final   Proteus species NOT DETECTED NOT DETECTED Final   Salmonella species NOT DETECTED NOT DETECTED Final   Serratia marcescens NOT DETECTED NOT DETECTED Final   Haemophilus influenzae NOT DETECTED NOT DETECTED Final   Neisseria meningitidis NOT DETECTED NOT DETECTED Final   Pseudomonas aeruginosa NOT DETECTED NOT DETECTED Final   Stenotrophomonas maltophilia NOT DETECTED NOT DETECTED Final   Candida albicans NOT DETECTED NOT DETECTED Final   Candida auris NOT DETECTED NOT DETECTED Final   Candida glabrata NOT DETECTED NOT DETECTED Final   Candida krusei NOT DETECTED NOT DETECTED Final   Candida parapsilosis NOT DETECTED NOT DETECTED Final   Candida tropicalis NOT DETECTED NOT DETECTED Final   Cryptococcus neoformans/gattii NOT DETECTED NOT DETECTED Final    Comment: Performed at [facility], [address], [place], [place] [address]  Blood culture (routine x 2)     Status: None (Preliminary result)   Collection Time: 02/14/23  3:11 AM   Specimen: BLOOD  Result Value Ref Range Status   Specimen Description   Final    BLOOD SITE NOT SPECIFIED Performed at [facility], [address] [place], [place] [address]    Special Requests   Final    BOTTLES DRAWN AEROBIC AND ANAEROBIC Blood Culture adequate volume Performed at [facility], [address] [place], [place] [address]    Culture   Final    NO GROWTH 4 DAYS Performed at [facility], [address] [place], [place] [address]    Report Status PENDING  Incomplete  Culture, blood (Routine X 2) w Reflex to ID Panel     Status: None (Preliminary result)   Collection Time: 02/15/23 10:12 AM   Specimen: BLOOD LEFT ARM  Result Value Ref Range Status   Specimen Description   Final    BLOOD LEFT ARM Performed at [facility], [address], [place], [place] [address]    Special Requests   Final    BOTTLES DRAWN  AEROBIC AND ANAEROBIC Blood Culture results may not be optimal due to an inadequate volume of blood received in culture bottles Performed at [facility], [address], [place], [place] [address]    Culture   Final    NO GROWTH 3 DAYS Performed at [facility], [address], [place], [place] [address]    Report Status PENDING  Incomplete  Culture, blood (Routine X 2) w Reflex to ID Panel     Status: None (Preliminary result)   Collection Time: 02/15/23 10:17 AM   Specimen: BLOOD LEFT HAND  Result Value Ref Range Status   Specimen Description   Final    BLOOD LEFT HAND Performed at [facility], [address], [place], [place] [address]    Special Requests   Final    BOTTLES DRAWN AEROBIC ONLY Blood Culture results may not be optimal due to an inadequate volume of blood received in culture bottles Performed at [facility], [address] [place], [place] [address]    Culture   Final    NO GROWTH 3 DAYS Performed at [facility], [address] [place], [place] [address]    Report Status PENDING  Incomplete    Impression/Plan:  1. Abscess right arm - plan for surgical debridement of this.  Blood cultures with GAS and on cefazolin plus linezolid for antitoxin effect.  Will continue and monitor surgical findings I do not anticipate prolonged IV antibiotics  2.  Penicillin allergy - he reports an intolerance to Augmentin with GI upset.  No allergy history and will delist and change cefazolin to penicillin as above.    3.  Heroin abuse - will need continued efforts and remaining drug-free

## 2023-02-18 NOTE — Consult Note (Signed)
[facility]    ORTHOPAEDIC CONSULTATION  REQUESTING PHYSICIAN: Dorcas Carrow, MD  Chief Complaint: Right arm abscess  HPI: Thomas Walter is a 27 y.o. male with  Thomas is a 27 year old male with a medical history significant for ADD, IV heroin abuse presented to the emergency room with progressively worsening right elbow swelling and pain.  He injects heroin daily  Past Medical History:  Diagnosis Date   ADD (attention deficit disorder with hyperactivity)    Left varicocele    Past Surgical History:  Procedure Laterality Date   MYRINGOTOMY     CROSSLEY   Social History   Socioeconomic History   Marital status: Single    Spouse name: Not on file   Number of children: Not on file   Years of education: Not on file   Highest education level: Not on file  Occupational History   Not on file  Tobacco Use   Smoking status: Never   Smokeless tobacco: Never  Vaping Use   Vaping status: Every Day  Substance and Sexual Activity   Alcohol use: No   Drug use: Yes    Types: IV    Comment: heroin daily last used 6 hours pta   Sexual activity: Yes  Other Topics Concern   Not on file  Social History Narrative   Not on file   Social Drivers of Health   Financial Resource Strain: Not on file  Food Insecurity: No Food Insecurity (02/14/2023)   Hunger Vital Sign    Worried About Running Out of Food in the Last Year: Never true    Ran Out of Food in the Last Year: Never true  Transportation Needs: No Transportation Needs (02/14/2023)   PRAPARE - Administrator, Civil Service (Medical): No    Lack of Transportation (Non-Medical): No  Physical Activity: Not on file  Stress: Not on file  Social Connections: Not on file   History reviewed. No pertinent family history. Allergies  Allergen Reactions   Amoxicillin-Pot Clavulanate Nausea And Vomiting   Prior to Admission medications   Medication Sig Start Date End Date Taking? Authorizing Provider  amphetamine-dextroamphetamine  (ADDERALL XR) 20 MG 24 hr capsule Take 1 capsule (20 mg total) by mouth every morning. 01/01/13 11/13/18  Ronnald Nian, MD   No results found. Family History Reviewed and non-contributory, no pertinent history of problems with bleeding or anesthesia      Review of Systems 14 system ROS conducted and negative except for that noted in HPI   OBJECTIVE  Vitals:Patient Vitals for the past 8 hrs:  BP Temp Temp src Pulse Resp SpO2 Height Weight  02/18/23 1720 -- -- -- -- 16 -- -- --  02/18/23 1700 -- -- -- -- 16 -- -- --  02/18/23 1640 -- -- -- -- 16 -- -- --  02/18/23 1620 116/75 -- -- 82 20 98 % -- --  02/18/23 1548 -- -- -- -- -- -- 5\' 11"  (1.803 m) 68 kg  02/18/23 1546 121/81 98.3 F (36.8 C) Oral 78 18 98 % -- --  02/18/23 1159 (!) 97/56 98.3 F (36.8 C) -- 67 18 99 % -- --   General: Alert, no acute distress Cardiovascular: Warm extremities noted Respiratory: No cyanosis, no use of accessory musculature GI: No organomegaly, abdomen is soft and non-tender Skin: No lesions in the area of chief complaint other than those listed below in MSK exam.  Neurologic: Sensation intact distally save for the below mentioned MSK exam Psychiatric:  Patient is competent for consent with normal mood and affect Lymphatic: No swelling obvious and reported other than the area involved in the exam below Extremities  RUE: Track marks on long arm Large area of fluctuance and swelling at medial antecubital fossa with overlying track marks Elbow range of motion limited due to pain motor intact AIN/PIN/U Sensation intact M/R/U 2+ radial pulse  Test Results Imaging EXAM: CT OF THE UPPER RIGHT EXTREMITY WITH CONTRAST   TECHNIQUE: Multidetector CT imaging of the upper right extremity was performed according to the standard protocol following intravenous contrast administration.   RADIATION DOSE REDUCTION: This exam was performed according to the departmental dose-optimization program which  includes automated exposure control, adjustment of the mA and/or kV according to patient size and/or use of iterative reconstruction technique.   CONTRAST:  OMNIPAQUE IOHEXOL 300 MG/ML  SOLN   COMPARISON:  Forearm x-ray 02/14/2023   FINDINGS: Bones/Joint/Cartilage   No acute fracture. No dislocation. No significant arthropathy of the elbow. No elbow joint effusion. No erosion or periosteal elevation.   Ligaments   Suboptimally assessed by CT.   Muscles and Tendons   No acute musculotendinous abnormality by CT. No intramuscular fluid collection.   Soft tissues   Fluid collection in the medial aspect of the antecubital fossa with partial rim enhancement measuring approximately 4.0 x 1.8 x 3.8 cm. Generalized soft tissue swelling is more pronounced anteriorly. No additional fluid collections. No soft tissue gas. No radiopaque foreign body.   IMPRESSION: 1. Fluid collection in the medial aspect of the antecubital fossa with partial rim enhancement measuring approximately 4.0 x 1.8 x 3.8 cm, suspicious for phlegmon/developing abscess. 2. Generalized soft tissue swelling is more pronounced anteriorly and suggestive of cellulitis. 3. No evidence of osteomyelitis or septic arthritis of the right elbow.     Electronically Signed   By: Duanne Guess D.O.   On: 02/15/2023 15:01  Labs cbc Recent Labs    02/16/23 0446  WBC 10.1  HGB 11.5*  HCT 36.2*  PLT 221    Labs inflam No results for input(s): "CRP" in the last 72 hours.  Invalid input(s): "ESR"  Labs coag No results for input(s): "INR", "PTT" in the last 72 hours.  Invalid input(s): "PT"  Recent Labs    02/16/23 0446  NA 137  K 4.2  CL 104  CO2 27  GLUCOSE 97  BUN 14  CREATININE 0.69  CALCIUM 8.1*     ASSESSMENT AND PLAN: 27 y.o. male with the following: Right arm abscess at the medial antecubital fossa  Patient has positive blood cultures for strep a collagenase.  He has failed  improvement with IV antibiotic treatment.  He requires an I&D of his abscess.  Surgical plan discussed patient.  All questions answered.  The risks benefits discussed with patient.  These risks include but are not limited to bleeding, damage to neurovascular structures, need for further surgery and cardiopulmonary comp case from anesthesia.  The patient understands this and wished to proceed with surgery  NPO Continue IV antibiotics Rest of medical management per primary team

## 2023-02-18 NOTE — Progress Notes (Signed)
Continued Recruitment consultant while in Short Stay.

## 2023-02-18 NOTE — Anesthesia Procedure Notes (Signed)
Procedure Name: LMA Insertion Date/Time: 02/18/2023 6:13 PM  Performed by: Floydene Flock, CRNAPre-anesthesia Checklist: Patient identified, Emergency Drugs available, Suction available, Patient being monitored and Timeout performed Patient Re-evaluated:Patient Re-evaluated prior to induction Oxygen Delivery Method: Circle system utilized Preoxygenation: Pre-oxygenation with 100% oxygen Induction Type: IV induction Ventilation: Mask ventilation without difficulty LMA: LMA inserted LMA Size: 4.0 Number of attempts: 1 Placement Confirmation: positive ETCO2

## 2023-02-18 NOTE — Anesthesia Preprocedure Evaluation (Signed)
Anesthesia Evaluation  Patient identified by MRN, date of birth, ID band Patient awake    Reviewed: Allergy & Precautions, H&P , NPO status , Patient's Chart, lab work & pertinent test results  Airway Mallampati: II  TM Distance: >3 FB Neck ROM: Full    Dental no notable dental hx.    Pulmonary neg pulmonary ROS, Patient abstained from smoking.   Pulmonary exam normal breath sounds clear to auscultation       Cardiovascular negative cardio ROS Normal cardiovascular exam Rhythm:Regular Rate:Normal     Neuro/Psych ADD  negative psych ROS   GI/Hepatic negative GI ROS, Neg liver ROS,,,  Endo/Other  negative endocrine ROS    Renal/GU negative Renal ROS  negative genitourinary   Musculoskeletal negative musculoskeletal ROS (+)    Abdominal   Peds negative pediatric ROS (+)  Hematology  (+) Blood dyscrasia, anemia   Anesthesia Other Findings IVDU, abscess of RUE  Reproductive/Obstetrics negative OB ROS                              Anesthesia Physical Anesthesia Plan  ASA: 2  Anesthesia Plan: General   Post-op Pain Management:    Induction: Intravenous  PONV Risk Score and Plan: 3 and Ondansetron and Dexamethasone  Airway Management Planned: LMA  Additional Equipment:   Intra-op Plan:   Post-operative Plan: Extubation in OR  Informed Consent: I have reviewed the patients History and Physical, chart, labs and discussed the procedure including the risks, benefits and alternatives for the proposed anesthesia with the patient or authorized representative who has indicated his/her understanding and acceptance.     Dental advisory given  Plan Discussed with: CRNA  Anesthesia Plan Comments:          Anesthesia Quick Evaluation

## 2023-02-19 ENCOUNTER — Encounter (HOSPITAL_COMMUNITY): Payer: Self-pay | Admitting: Orthopedic Surgery

## 2023-02-19 DIAGNOSIS — L03114 Cellulitis of left upper limb: Secondary | ICD-10-CM | POA: Diagnosis not present

## 2023-02-19 LAB — CULTURE, BLOOD (ROUTINE X 2)
Culture: NO GROWTH
Special Requests: ADEQUATE

## 2023-02-19 LAB — HEPATITIS B SURFACE ANTIBODY, QUANTITATIVE: Hep B S AB Quant (Post): <3.5 m[IU]/mL — ABNORMAL LOW

## 2023-02-19 MED ORDER — TRAZODONE HCL 50 MG PO TABS: 50.0000 mg | ORAL_TABLET | Freq: Every evening | ORAL | Status: DC | PRN

## 2023-02-19 MED ORDER — HYDROMORPHONE HCL 1 MG/ML IJ SOLN
1.0000 mg | Freq: Three times a day (TID) | INTRAMUSCULAR | Status: DC | PRN
  Administered 2023-02-19: 1 mg via INTRAVENOUS
  Filled 2023-02-19: qty 1

## 2023-02-19 NOTE — Progress Notes (Signed)
 PROGRESS NOTE    Thomas Walter  FMW:[id number] DOB: [birth year] DOA: 02/14/2023 PCP: Joyce Norleen BROCKS, MD    Brief Narrative:  27 year old with history of ADD, IV heroin use presented with 3-days history of left upper extremity erythema, edema, pain as well as right elbow pain.  In the emergency room white cell count 12.1, hemoglobin 11.4.  X-ray showing soft tissue swelling.  No foreign body. Blood cultures growing Streptococcus. Patient was found injecting heroin in the hospital, again snorting heroin in the hospital.  No visitors.  Now remains with safety observation.  Subjective:  Patient seen and examined.  Denies any complaints.  He looks fairly comfortable and interactive today.  Prefers to sleep all the time. Pain is controlled.    Assessment & Plan:   Streptococcal bacteremia: Diffuse soft tissue infection, cellulitis left dorsum of the hand, cellulitis right elbow secondary to heroin injection. -Blood cultures positive for Streptococcus.  Repeat cultures negative.  Previously on cefazolin  and Zyvox .  Currently on penicillin .  ID following.  He tolerated without allergic reaction.  -Repeat blood cultures negative so far.  2D echo without any evidence of vegetation. - CT scan with contrast left hand with diffuse cellulitis.  Clinically improving.  -CT scan of the right elbow with cellulitis and induration, I&D by orthopedics 12/30.  Probable drain removal tomorrow.    Heroin abuse: Ongoing active use of heroin.  Previously on Suboxone , resumed.  Toradol  for pain control. Suspected using heroin in the hospital room. No visitors allowed. Patient does have painful condition, using Dilaudid  and tramadol , Toradol  for acute pain. Space out Dilaudid  today in anticipation of discharge next 24 to 48 hours.  Smoker: Nicotine  patch.  Mobilize.  Anticipate discharge on oral antibiotics next 24 to 48 hours once right elbow is stable.   DVT prophylaxis: enoxaparin  (LOVENOX ) injection  40 mg Start: 02/14/23 2200   Code Status: Full code Family Communication: None at the bedside Disposition Plan: Status is: Inpatient Remains inpatient appropriate because: Bacteremia, IV antibiotics     Consultants:  Infectious disease, Orthopedics   Procedures:  I&D right arm  Antimicrobials:  Vancomycin  12/26-12/27 Cefazolin  12/27--30 Zyvox  12/27--30 Penicillin  12/30---     Objective: Vitals:   02/18/23 2121 02/19/23 0114 02/19/23 0520 02/19/23 1026  BP: 122/74 111/68 (!) 104/56 119/65  Pulse: 64 (!) 54 (!) 50 67  Resp: 17 16 17 16   Temp: 98.1 F (36.7 C) 97.6 F (36.4 C) 97.6 F (36.4 C) 98.3 F (36.8 C)  TempSrc: Oral   Oral  SpO2: 99% 98% 98% 99%  Weight:      Height:        Intake/Output Summary (Last 24 hours) at 02/19/2023 1118 Last data filed at 02/18/2023 1925 Gross per 24 hour  Intake 1380 ml  Output --  Net 1380 ml   Filed Weights   02/14/23 0200 02/18/23 1548  Weight: 68 kg 68 kg    Examination:  General exam: Not in any distress.  Looks comfortable.  Behaves appropriately.  Answers questions appropriately. Respiratory system: Clear to auscultation. Respiratory effort normal. Cardiovascular system: S1 & S2 heard, RRR.  Gastrointestinal system: Abdomen is nondistended, soft and nontender. No organomegaly or masses felt. Normal bowel sounds heard. Central nervous system: Alert and oriented. No focal neurological deficits.   Extremities: Symmetric 5 x 5 power. Skin:  Mild erythema dorsum of the left hand.  Mostly improved. Right elbow on postoperative dressing.  Not removed by me.      Data Reviewed:  I have personally reviewed following labs and imaging studies  CBC: Recent Labs  Lab 02/14/23 0155 02/15/23 0430 02/16/23 0446  WBC 12.1* 10.5 10.1  NEUTROABS 9.7*  --  7.2  HGB 11.4* 11.2* 11.5*  HCT 33.4* 34.3* 36.2*  MCV 89.3 91.7 92.3  PLT 225 191 221   Basic Metabolic Panel: Recent Labs  Lab 02/14/23 0122  02/15/23 0430 02/16/23 0446  NA 132* 134* 137  K 4.0 3.6 4.2  CL 101 101 104  CO2 26 23 27   GLUCOSE 112* 111* 97  BUN 14 12 14   CREATININE 0.78 0.63 0.69  CALCIUM 8.5* 8.3* 8.1*  MG 2.3  --  2.1  PHOS 3.5  --  4.1   GFR: Estimated Creatinine Clearance: 135.8 mL/min (by C-G formula based on SCr of 0.69 mg/dL). Liver Function Tests: Recent Labs  Lab 02/14/23 0122 02/15/23 0430 02/16/23 0446  AST 22 17 15   ALT 33 26 20  ALKPHOS 75 70 72  BILITOT 0.4 0.3 0.2  PROT 7.2 6.0* 6.4*  ALBUMIN 3.5 2.8* 2.6*   No results for input(s): LIPASE, AMYLASE in the last 168 hours. No results for input(s): AMMONIA in the last 168 hours. Coagulation Profile: No results for input(s): INR, PROTIME in the last 168 hours. Cardiac Enzymes: No results for input(s): CKTOTAL, CKMB, CKMBINDEX, TROPONINI in the last 168 hours. BNP (last 3 results) No results for input(s): PROBNP in the last 8760 hours. HbA1C: No results for input(s): HGBA1C in the last 72 hours. CBG: No results for input(s): GLUCAP in the last 168 hours. Lipid Profile: No results for input(s): CHOL, HDL, LDLCALC, TRIG, CHOLHDL, LDLDIRECT in the last 72 hours. Thyroid Function Tests: No results for input(s): TSH, T4TOTAL, FREET4, T3FREE, THYROIDAB in the last 72 hours. Anemia Panel: No results for input(s): VITAMINB12, FOLATE, FERRITIN, TIBC, IRON, RETICCTPCT in the last 72 hours. Sepsis Labs: Recent Labs  Lab 02/14/23 0122  LATICACIDVEN 0.7    Recent Results (from the past 240 hours)  Blood culture (routine x 2)     Status: Abnormal   Collection Time: 02/14/23  1:58 AM   Specimen: BLOOD  Result Value Ref Range Status   Specimen Description   Final    BLOOD SITE NOT SPECIFIED Performed at [facility], [address] [place], [place] [address]    Special Requests   Final    BOTTLES DRAWN AEROBIC AND ANAEROBIC Blood Culture results may not be  optimal due to an inadequate volume of blood received in culture bottles Performed at [facility], [address], [place], [place] [address]    Culture  Setup Time   Final    GRAM POSITIVE COCCI IN CHAINS ANAEROBIC BOTTLE ONLY CRITICAL RESULT CALLED TO, READ BACK BY AND VERIFIED WITH: PHARMD CRYSTAL ROBERTSON [id number] AT 0819 BY EC    Culture (A)  Final    GROUP A STREP (S.PYOGENES) ISOLATED HEALTH DEPARTMENT NOTIFIED Performed at [facility], [address] [place], [place] [address]    Report Status 02/17/2023 FINAL  Final   Organism ID, Bacteria GROUP A STREP (S.PYOGENES) ISOLATED  Final      Susceptibility   Group a strep (s.pyogenes) isolated - MIC*    PENICILLIN  <=0.06 SENSITIVE Sensitive     CEFTRIAXONE  <=0.12 SENSITIVE Sensitive     ERYTHROMYCIN <=0.12 SENSITIVE Sensitive     LEVOFLOXACIN 2 SENSITIVE Sensitive     VANCOMYCIN  0.5 SENSITIVE Sensitive     * GROUP A STREP (  S.PYOGENES) ISOLATED  Blood Culture ID Panel (Reflexed)     Status: Abnormal   Collection Time: 02/14/23  1:58 AM  Result Value Ref Range Status   Enterococcus faecalis NOT DETECTED NOT DETECTED Final   Enterococcus Faecium NOT DETECTED NOT DETECTED Final   Listeria monocytogenes NOT DETECTED NOT DETECTED Final   Staphylococcus species NOT DETECTED NOT DETECTED Final   Staphylococcus aureus (BCID) NOT DETECTED NOT DETECTED Final   Staphylococcus epidermidis NOT DETECTED NOT DETECTED Final   Staphylococcus lugdunensis NOT DETECTED NOT DETECTED Final   Streptococcus species DETECTED (A) NOT DETECTED Final    Comment: CRITICAL RESULT CALLED TO, READ BACK BY AND VERIFIED WITH: PHARMD CRYSTAL ROBERTSON [id number] AT 0819 BY EC    Streptococcus agalactiae NOT DETECTED NOT DETECTED Final   Streptococcus pneumoniae NOT DETECTED NOT DETECTED Final   Streptococcus pyogenes DETECTED (A) NOT DETECTED Final    Comment: CRITICAL RESULT CALLED TO, READ BACK BY AND VERIFIED WITH: PHARMD  CRYSTAL ROBERTSON [id number] AT 0819 BY EC    A.calcoaceticus-baumannii NOT DETECTED NOT DETECTED Final   Bacteroides fragilis NOT DETECTED NOT DETECTED Final   Enterobacterales NOT DETECTED NOT DETECTED Final   Enterobacter cloacae complex NOT DETECTED NOT DETECTED Final   Escherichia coli NOT DETECTED NOT DETECTED Final   Klebsiella aerogenes NOT DETECTED NOT DETECTED Final   Klebsiella oxytoca NOT DETECTED NOT DETECTED Final   Klebsiella pneumoniae NOT DETECTED NOT DETECTED Final   Proteus species NOT DETECTED NOT DETECTED Final   Salmonella species NOT DETECTED NOT DETECTED Final   Serratia marcescens NOT DETECTED NOT DETECTED Final   Haemophilus influenzae NOT DETECTED NOT DETECTED Final   Neisseria meningitidis NOT DETECTED NOT DETECTED Final   Pseudomonas aeruginosa NOT DETECTED NOT DETECTED Final   Stenotrophomonas maltophilia NOT DETECTED NOT DETECTED Final   Candida albicans NOT DETECTED NOT DETECTED Final   Candida auris NOT DETECTED NOT DETECTED Final   Candida glabrata NOT DETECTED NOT DETECTED Final   Candida krusei NOT DETECTED NOT DETECTED Final   Candida parapsilosis NOT DETECTED NOT DETECTED Final   Candida tropicalis NOT DETECTED NOT DETECTED Final   Cryptococcus neoformans/gattii NOT DETECTED NOT DETECTED Final    Comment: Performed at [facility], [address] [place], [place] [address]  Blood culture (routine x 2)     Status: None   Collection Time: 02/14/23  3:11 AM   Specimen: BLOOD  Result Value Ref Range Status   Specimen Description   Final    BLOOD SITE NOT SPECIFIED Performed at [facility], [address] [place], [place] [address]    Special Requests   Final    BOTTLES DRAWN AEROBIC AND ANAEROBIC Blood Culture adequate volume Performed at [facility], [address] [place], [place] [address]    Culture   Final    NO GROWTH 5 DAYS Performed at [facility], [address] [place],  [place] [address]    Report Status 02/19/2023 FINAL  Final  Culture, blood (Routine X 2) w Reflex to ID Panel     Status: None (Preliminary result)   Collection Time: 02/15/23 10:12 AM   Specimen: BLOOD LEFT ARM  Result Value Ref Range Status   Specimen Description   Final    BLOOD LEFT ARM Performed at [facility], [address] [place], [place] [address]    Special Requests   Final    BOTTLES DRAWN AEROBIC AND ANAEROBIC Blood Culture results may  not be optimal due to an inadequate volume of blood received in culture bottles Performed at [facility], [address], [place], [place] [address]    Culture   Final    NO GROWTH 4 DAYS Performed at [facility], [address] [place], [place] [address]    Report Status PENDING  Incomplete  Culture, blood (Routine X 2) w Reflex to ID Panel     Status: None (Preliminary result)   Collection Time: 02/15/23 10:17 AM   Specimen: BLOOD LEFT HAND  Result Value Ref Range Status   Specimen Description   Final    BLOOD LEFT HAND Performed at [facility], [address] [place], [place] [address]    Special Requests   Final    BOTTLES DRAWN AEROBIC ONLY Blood Culture results may not be optimal due to an inadequate volume of blood received in culture bottles Performed at [facility], [address], [place], [place] [address]    Culture   Final    NO GROWTH 4 DAYS Performed at [facility], [address], [place], [place] [address]    Report Status PENDING  Incomplete  Aerobic/Anaerobic Culture w Gram Stain (surgical/deep wound)     Status: None (Preliminary result)   Collection Time: 02/18/23  6:23 PM   Specimen: Arm, Right; Abscess  Result Value Ref Range Status   Specimen Description ABSCESS  Final   Special Requests RIGHT ARM  Final   Gram Stain   Final    FEW WBC PRESENT, PREDOMINANTLY PMN NO ORGANISMS SEEN    Culture   Final    NO GROWTH < 12 HOURS Performed at  [facility], [address], [place], [place] [address]    Report Status PENDING  Incomplete         Radiology Studies: No results found.       Scheduled Meds:  buprenorphine -naloxone   1 tablet Sublingual BID   enoxaparin  (LOVENOX ) injection  40 mg Subcutaneous Q24H   feeding supplement  237 mL Oral BID BM   linezolid   600 mg Oral Q12H   nicotine   21 mg Transdermal Daily   Continuous Infusions:  penicillin  G potassium 12 Million Units in sodium chloride  0.9 % 500 mL CONTINUOUS infusion 12 Million Units (02/19/23 0619)     LOS: 5 days    Time spent: 35 minutes    Renato Applebaum, MD [facility]

## 2023-02-19 NOTE — Progress Notes (Signed)
                                                  Against Medical Advice Patient at this time expresses desire to leave the Hospital immediately, patient has been warned that this is not Medically advisable at this time, and can result in Medical complications like Death and Disability, patient understands and accepts the risks involved and assumes full responsibilty of this decision.  This patient has also been advised that if they feel the need for further medical assistance to return to any available ER or dial 9-1-1.  Informed by Nursing staff that this patient has left care and has signed the form  Against Medical Advice on 02/19/2023 at 2054 Hrs.  Lynwood Kipper BSN MSNA MSN ACNPC-AG Acute Care Nurse Practitioner [facility]

## 2023-02-19 NOTE — Progress Notes (Signed)
.  Subjective: 1 Day Post-Op Procedure(s) (LRB): INCISION AND DRAINAGE ABSCESS RIGHT ARM ABSCESS (Right)  Doing well. Minimal pain at this time in arm.  Objective: Vital signs in last 24 hours: Temp:  [97.2 F (36.2 C)-98.4 F (36.9 C)] 98.2 F (36.8 C) (12/31 1307) Pulse Rate:  [50-101] 101 (12/31 1307) Resp:  [10-20] 16 (12/31 1307) BP: (104-130)/(56-100) 109/71 (12/31 1307) SpO2:  [98 %-100 %] 99 % (12/31 1307)  Labs: No results for input(s): HGB in the last 72 hours. No results for input(s): WBC, RBC, HCT, PLT in the last 72 hours. No results for input(s): NA, K, CL, CO2, BUN, CREATININE, GLUCOSE, CALCIUM in the last 72 hours. No results for input(s): LABPT, INR in the last 72 hours.  Physical Exam: Penrose drained removed Incision with moderate serosanguinous drainage No fluctuance appreciated Motor/sensory intact 2+ radial pulse  Assessment/Plan:  1 Day Post-Op Procedure(s) (LRB): INCISION AND DRAINAGE ABSCESS RIGHT ARM ABSCESS (Right)  Continue IV abx per ID Follow up intra-op culture results, NGTD Daily dry dressing changes right arm. Patient can continue dressing changes after discharge Patient can shower and allow soapy water to run over wound Ortho to follow while inpatient. Can follow up 3-4 days after discharge for wound check    Thomas Walter Mon 02/19/2023, 3:59 PM

## 2023-02-19 NOTE — Plan of Care (Signed)

## 2023-02-19 NOTE — Progress Notes (Signed)
 Pharmacy Note- Penicillin  Allergy Clarification   ASSESSMENT:   PEN-FAST Scoring   Five years or less since last reaction 0  Anaphylaxis/Angioedema OR Severe cutaneous adverse reaction  0  Treatment required for reaction  0  Total Score 0 points - Very low risk of positive penicillin  allergy test (<1%)      Type of intervention (select all that apply):  Re-label as intolerance and Direct IV therapy  Impact on therapy (select all that apply):  Antibiotic de-escalation   PLAN: - Patient reports nausea with augmentin . Re-labeled allergy as intolerance and administered IV penicillin . Patient had no reaction with IV penicillin .    Damien Quiet, PharmD, BCPS, BCIDP Infectious Diseases Clinical Pharmacist Phone: [phone] 02/19/2023 12:00 PM

## 2023-02-19 NOTE — Plan of Care (Signed)
  Problem: Education: Goal: Knowledge of General Education information will improve Description: Including pain rating scale, medication(s)/side effects and non-pharmacologic comfort measures Outcome: Progressing   Problem: Clinical Measurements: Goal: Ability to maintain clinical measurements within normal limits will improve Outcome: Progressing Goal: Will remain free from infection Outcome: Progressing Goal: Diagnostic test results will improve Outcome: Progressing Goal: Respiratory complications will improve Outcome: Progressing Goal: Cardiovascular complication will be avoided Outcome: Progressing   Problem: Nutrition: Goal: Adequate nutrition will be maintained Outcome: Progressing   Problem: Coping: Goal: Level of anxiety will decrease Outcome: Progressing   Problem: Pain Management: Goal: General experience of comfort will improve Outcome: Progressing   Problem: Safety: Goal: Ability to remain free from injury will improve Outcome: Progressing

## 2023-02-19 NOTE — Progress Notes (Signed)
 Patient requesting to be discharged from the hospital immediately . Pt states I don't want any problems , I just want to go home, I was here Christmas and now it is New Years. Patient educated on importance of completing IV Pcn antibiotics and the importance of follow up care. Patient verbalizes an understanding and states  I am leaving tonight. Lynwood Kipper , NP notified as well as Lorie AC . #20 iv angiocath removed from lt forearm, no redness , swelling or drainage noted. Patient left with all belongings. Patient also signed ama form.

## 2023-02-19 NOTE — Anesthesia Postprocedure Evaluation (Signed)
Anesthesia Post Note  Patient: Thomas Walter  Procedure(s) Performed: INCISION AND DRAINAGE ABSCESS RIGHT ARM ABSCESS (Right)     Patient location during evaluation: PACU Anesthesia Type: General Level of consciousness: awake and alert Pain management: pain level controlled Vital Signs Assessment: post-procedure vital signs reviewed and stable Respiratory status: spontaneous breathing, nonlabored ventilation, respiratory function stable and patient connected to nasal cannula oxygen Cardiovascular status: blood pressure returned to baseline and stable Postop Assessment: no apparent nausea or vomiting Anesthetic complications: no   No notable events documented.  Last Vitals:  Vitals:   02/19/23 0114 02/19/23 0520  BP: 111/68 (!) 104/56  Pulse: (!) 54 (!) 50  Resp: 16 17  Temp: 36.4 C 36.4 C  SpO2: 98% 98%    Last Pain:  Vitals:   02/19/23 0409  TempSrc:   PainSc: Asleep                 Dawson Nation

## 2023-02-20 LAB — CULTURE, BLOOD (ROUTINE X 2)
Culture: NO GROWTH
Culture: NO GROWTH

## 2023-02-23 LAB — AEROBIC/ANAEROBIC CULTURE W GRAM STAIN (SURGICAL/DEEP WOUND): Culture: NO GROWTH

## 2023-02-24 NOTE — Discharge Summary (Signed)
 Physician Discharge Summary  Thomas Walter FMW:[id number] DOB: 1995-12-31 DOA: 02/14/2023  PCP: Joyce Norleen BROCKS, MD  Admit date: 02/14/2023 Discharge date: 02/24/2023  Admitted From: Home Disposition: Home  Recommendations for Outpatient Follow-up:  Follow up with PCP in 1-2 weeks   Discharge Condition: Fair CODE STATUS: Full code Diet recommendation: Regular diet  Discharge summary:  Patient with history of ADD and IV heroin use was admitted with 3 days of left upper extremity erythema edema right elbow pain.  He was found to have Streptococcus bacteremia and was treated with IV penicillin .  Underwent I&D on the right elbow with orthopedics, pus removed.  No growth so far.  He had done good clinical recovery on antibiotics and improved infection.  Apparently, patient left AGAINST MEDICAL ADVICE during night hours.  He was competent enough to make his decisions. Blood culture with group A streptococcus, he received 5 days of IV penicillin .  Patient did not wait to talk to this provider or to provide instructions and further antibiotics and medication management.   Discharge Diagnoses:  Principal Problem:   Cellulitis of left upper extremity Active Problems:   Heroin abuse (HCC)   IVDU (intravenous drug user)   Hyponatremia   Normocytic anemia    Discharge Instructions   Allergies as of 02/19/2023       Reactions   Amoxicillin -pot Clavulanate Nausea And Vomiting        Medication List    You have not been prescribed any medications.     Allergies  Allergen Reactions   Amoxicillin -Pot Clavulanate Nausea And Vomiting    Consultations: Orthopedics ID   Procedures/Studies: CT HAND LEFT W CONTRAST Result Date: 02/15/2023 CLINICAL DATA:  Soft tissue infection suspected, hand, xray done bacteremia, abscess suspected EXAM: CT OF THE UPPER LEFT EXTREMITY WITH CONTRAST TECHNIQUE: Multidetector CT imaging of the left hand was performed according to the standard  protocol following intravenous contrast administration. RADIATION DOSE REDUCTION: This exam was performed according to the departmental dose-optimization program which includes automated exposure control, adjustment of the mA and/or kV according to patient size and/or use of iterative reconstruction technique. CONTRAST:  OMNIPAQUE  IOHEXOL  300 MG/ML  SOLN COMPARISON:  X-ray 02/14/2023 FINDINGS: Bones/Joint/Cartilage No acute fracture. No dislocation. No significant arthropathy of the hand or wrist. No appreciable joint effusion of the wrist. No erosion or periosteal elevation. Ligaments Suboptimally assessed by CT. Muscles and Tendons No acute musculotendinous abnormality by CT. Soft tissues Prominent soft tissue swelling and edema of the dorsal hand and wrist. No organized or rim enhancing fluid collections are evident. No soft tissue gas. No radiopaque foreign body. IMPRESSION: 1. Prominent soft tissue swelling and edema of the dorsal hand and wrist, compatible with cellulitis. No organized or rim enhancing fluid collections are evident. 2. No acute osseous abnormality of the left hand or wrist. Electronically Signed   By: Mabel Converse D.O.   On: 02/15/2023 15:07   CT EBLOW RIGHT W CONTRAST Result Date: 02/15/2023 CLINICAL DATA:  Osteomyelitis, elbow abscess suspected EXAM: CT OF THE UPPER RIGHT EXTREMITY WITH CONTRAST TECHNIQUE: Multidetector CT imaging of the upper right extremity was performed according to the standard protocol following intravenous contrast administration. RADIATION DOSE REDUCTION: This exam was performed according to the departmental dose-optimization program which includes automated exposure control, adjustment of the mA and/or kV according to patient size and/or use of iterative reconstruction technique. CONTRAST:  OMNIPAQUE  IOHEXOL  300 MG/ML  SOLN COMPARISON:  Forearm x-ray 02/14/2023 FINDINGS: Bones/Joint/Cartilage No acute fracture. No  dislocation. No significant  arthropathy of the elbow. No elbow joint effusion. No erosion or periosteal elevation. Ligaments Suboptimally assessed by CT. Muscles and Tendons No acute musculotendinous abnormality by CT. No intramuscular fluid collection. Soft tissues Fluid collection in the medial aspect of the antecubital fossa with partial rim enhancement measuring approximately 4.0 x 1.8 x 3.8 cm. Generalized soft tissue swelling is more pronounced anteriorly. No additional fluid collections. No soft tissue gas. No radiopaque foreign body. IMPRESSION: 1. Fluid collection in the medial aspect of the antecubital fossa with partial rim enhancement measuring approximately 4.0 x 1.8 x 3.8 cm, suspicious for phlegmon/developing abscess. 2. Generalized soft tissue swelling is more pronounced anteriorly and suggestive of cellulitis. 3. No evidence of osteomyelitis or septic arthritis of the right elbow. Electronically Signed   By: Mabel Converse D.O.   On: 02/15/2023 15:01   ECHOCARDIOGRAM COMPLETE Result Date: 02/15/2023    ECHOCARDIOGRAM REPORT   Patient Name:   Thomas Walter Date of Exam: 02/15/2023 Medical Rec #:  [id number]    Height:       71.0 in Accession #:    [id number]   Weight:       149.9 lb Date of Birth:  Apr 11, 1995    BSA:          1.865 m Patient Age:    28 years     BP:           106/59 mmHg Patient Gender: M            HR:           66 bpm. Exam Location:  Inpatient Procedure: 2D Echo, Cardiac Doppler and Color Doppler Indications:    Bacteremia  History:        Patient has no prior history of Echocardiogram examinations.  Sonographer:    Ozell Free Referring Phys: [id number] RENATO APPLEBAUM IMPRESSIONS  1. Left ventricular ejection fraction, by estimation, is 60 to 65%. The left ventricle has normal function. The left ventricle has no regional wall motion abnormalities. Left ventricular diastolic parameters were normal.  2. Right ventricular systolic function is normal. The right ventricular size is normal. Tricuspid  regurgitation signal is inadequate for assessing PA pressure.  3. The mitral valve is normal in structure. Trivial mitral valve regurgitation. No evidence of mitral stenosis.  4. The aortic valve is tricuspid. Aortic valve regurgitation is not visualized.  5. The inferior vena cava is normal in size with greater than 50% respiratory variability, suggesting right atrial pressure of 3 mmHg. Comparison(s): No prior Echocardiogram. FINDINGS  Left Ventricle: Left ventricular ejection fraction, by estimation, is 60 to 65%. The left ventricle has normal function. The left ventricle has no regional wall motion abnormalities. The left ventricular internal cavity size was normal in size. There is  no left ventricular hypertrophy. Left ventricular diastolic parameters were normal. Right Ventricle: The right ventricular size is normal. Right ventricular systolic function is normal. Tricuspid regurgitation signal is inadequate for assessing PA pressure. The tricuspid regurgitant velocity is 1.90 m/s, and with an assumed right atrial  pressure of 3 mmHg, the estimated right ventricular systolic pressure is 17.4 mmHg. Left Atrium: Left atrial size was normal in size. Right Atrium: Right atrial size was normal in size. Pericardium: There is no evidence of pericardial effusion. Mitral Valve: The mitral valve is normal in structure. Trivial mitral valve regurgitation. No evidence of mitral valve stenosis. Tricuspid Valve: The tricuspid valve is normal in structure. Tricuspid valve regurgitation is trivial. No  evidence of tricuspid stenosis. Aortic Valve: The aortic valve is tricuspid. Aortic valve regurgitation is not visualized. Aortic valve mean gradient measures 3.0 mmHg. Aortic valve peak gradient measures 6.6 mmHg. Aortic valve area, by VTI measures 3.09 cm. Pulmonic Valve: The pulmonic valve was normal in structure. Pulmonic valve regurgitation is not visualized. No evidence of pulmonic stenosis. Aorta: The aortic root is  normal in size and structure. Venous: The inferior vena cava is normal in size with greater than 50% respiratory variability, suggesting right atrial pressure of 3 mmHg. IAS/Shunts: No atrial level shunt detected by color flow Doppler.  LEFT VENTRICLE PLAX 2D LVIDd:         5.10 cm   Diastology LVIDs:         3.20 cm   LV e' medial:    15.40 cm/s LV PW:         0.70 cm   LV E/e' medial:  5.1 LV IVS:        0.70 cm   LV e' lateral:   14.70 cm/s LVOT diam:     2.20 cm   LV E/e' lateral: 5.4 LV SV:         72 LV SV Index:   39 LVOT Area:     3.80 cm  RIGHT VENTRICLE             IVC RV Basal diam:  4.00 cm     IVC diam: 2.00 cm RV S prime:     13.90 cm/s TAPSE (M-mode): 3.6 cm LEFT ATRIUM             Index        RIGHT ATRIUM           Index LA diam:        3.20 cm 1.72 cm/m   RA Area:     18.30 cm LA Vol (A2C):   40.9 ml 21.93 ml/m  RA Volume:   54.50 ml  29.22 ml/m LA Vol (A4C):   24.5 ml 13.13 ml/m LA Biplane Vol: 32.4 ml 17.37 ml/m  AORTIC VALVE AV Area (Vmax):    3.03 cm AV Area (Vmean):   3.02 cm AV Area (VTI):     3.09 cm AV Vmax:           128.00 cm/s AV Vmean:          80.700 cm/s AV VTI:            0.234 m AV Peak Grad:      6.6 mmHg AV Mean Grad:      3.0 mmHg LVOT Vmax:         102.00 cm/s LVOT Vmean:        64.100 cm/s LVOT VTI:          0.190 m LVOT/AV VTI ratio: 0.81  AORTA Ao Root diam: 3.20 cm MITRAL VALVE               TRICUSPID VALVE MV Area (PHT): 3.77 cm    TR Peak grad:   14.4 mmHg MV Decel Time: 201 msec    TR Vmax:        190.00 cm/s MV E velocity: 78.80 cm/s MV A velocity: 42.40 cm/s  SHUNTS MV E/A ratio:  1.86        Systemic VTI:  0.19 m  Systemic Diam: 2.20 cm Redell Shallow MD Electronically signed by Redell Shallow MD Signature Date/Time: 02/15/2023/2:40:40 PM    Final    DG Hand Complete Left Result Date: 02/14/2023 CLINICAL DATA:  Cellulitis left hand and wrist. EXAM: LEFT HAND - COMPLETE 3+ VIEW COMPARISON:  None Available. FINDINGS: Soft tissue  swelling along the dorsum of the hand. No radiopaque foreign body. No acute bony abnormality. Specifically, no fracture, subluxation, or dislocation. Joint spaces maintained. IMPRESSION: Dorsal soft tissue swelling.  No acute bony abnormality. Electronically Signed   By: Franky Crease M.D.   On: 02/14/2023 01:40   DG Wrist Complete Left Result Date: 02/14/2023 CLINICAL DATA:  Cellulitis left hand and wrist. EXAM: LEFT WRIST - COMPLETE 3+ VIEW COMPARISON:  None Available. FINDINGS: Soft tissue swelling along the dorsum of the hand. No acute bony abnormality. Specifically, no fracture, subluxation, or dislocation. No radiopaque foreign body. Joint spaces maintained. IMPRESSION: Dorsal soft tissue swelling.  No acute bony abnormality. Electronically Signed   By: Franky Crease M.D.   On: 02/14/2023 01:39   DG Forearm Right Result Date: 02/14/2023 CLINICAL DATA:  Cellulitis right forearm EXAM: RIGHT FOREARM - 2 VIEW COMPARISON:  None Available. FINDINGS: There is no evidence of fracture or other focal bone lesions. Soft tissues are unremarkable. No radiopaque foreign body. IMPRESSION: Negative. Electronically Signed   By: Franky Crease M.D.   On: 02/14/2023 01:39   (Echo, Carotid, EGD, Colonoscopy, ERCP)    Subjective: Patient left AGAINST MEDICAL ADVICE at night hours, unable to interact.   Discharge Exam: Vitals:   02/19/23 1710 02/19/23 1959  BP: (!) 133/90 (!) 100/57  Pulse: 76 100  Resp: 16 15  Temp: 98 F (36.7 C) 98.2 F (36.8 C)  SpO2: 97% 99%   Vitals:   02/19/23 1026 02/19/23 1307 02/19/23 1710 02/19/23 1959  BP: 119/65 109/71 (!) 133/90 (!) 100/57  Pulse: 67 (!) 101 76 100  Resp: 16 16 16 15   Temp: 98.3 F (36.8 C) 98.2 F (36.8 C) 98 F (36.7 C) 98.2 F (36.8 C)  TempSrc: Oral Oral Oral Oral  SpO2: 99% 99% 97% 99%  Weight:      Height:           The results of significant diagnostics from this hospitalization (including imaging, microbiology, ancillary and  laboratory) are listed below for reference.     Microbiology: Recent Results (from the past 240 hours)  Culture, blood (Routine X 2) w Reflex to ID Panel     Status: None   Collection Time: 02/15/23 10:12 AM   Specimen: BLOOD LEFT ARM  Result Value Ref Range Status   Specimen Description   Final    BLOOD LEFT ARM Performed at [facility], [address], [place], [place] [address]    Special Requests   Final    BOTTLES DRAWN AEROBIC AND ANAEROBIC Blood Culture results may not be optimal due to an inadequate volume of blood received in culture bottles Performed at [facility], [address] [place], [place] [address]    Culture   Final    NO GROWTH 5 DAYS Performed at [facility], [address] [place], [place] [address]    Report Status 02/20/2023 FINAL  Final  Culture, blood (Routine X 2) w Reflex to ID Panel     Status: None   Collection Time: 02/15/23 10:17 AM   Specimen: BLOOD LEFT HAND  Result Value Ref Range Status   Specimen  Description   Final    BLOOD LEFT HAND Performed at [facility], [address] [place], [place] [address]    Special Requests   Final    BOTTLES DRAWN AEROBIC ONLY Blood Culture results may not be optimal due to an inadequate volume of blood received in culture bottles Performed at [facility], [address], [place], [place] [address]    Culture   Final    NO GROWTH 5 DAYS Performed at [facility], [address] [place], [place] [address]    Report Status 02/20/2023 FINAL  Final  Fungus Culture With Stain     Status: None (Preliminary result)   Collection Time: 02/18/23  6:23 PM   Specimen: Arm, Right; Abscess  Result Value Ref Range Status   Fungus Stain Final report  Final    Comment: (NOTE) Performed At: [facility] [address] [place], [place] [id number] Jennette Shorter MD Ey:[id number]    Fungus (Mycology) Culture PENDING  Incomplete   Fungal  Source ARM  Final    Comment: Performed at [facility], [address] [place], [place] [address]  Aerobic/Anaerobic Culture w Gram Stain (surgical/deep wound)     Status: None   Collection Time: 02/18/23  6:23 PM   Specimen: Arm, Right; Abscess  Result Value Ref Range Status   Specimen Description ABSCESS  Final   Special Requests RIGHT ARM  Final   Gram Stain   Final    FEW WBC PRESENT, PREDOMINANTLY PMN NO ORGANISMS SEEN    Culture   Final    No growth aerobically or anaerobically. Performed at [facility], [address], [place], [place] [address]    Report Status 02/23/2023 FINAL  Final  Fungus Culture Result     Status: None   Collection Time: 02/18/23  6:23 PM  Result Value Ref Range Status   Result 1 Comment  Final    Comment: (NOTE) KOH/Calcofluor preparation:  no fungus observed. Performed At: [facility] [address] [place], [place] [id number] Jennette Shorter MD Ey:[id number]      Labs: BNP (last 3 results) No results for input(s): BNP in the last 8760 hours. Basic Metabolic Panel: No results for input(s): NA, K, CL, CO2, GLUCOSE, BUN, CREATININE, CALCIUM, MG, PHOS in the last 168 hours. Liver Function Tests: No results for input(s): AST, ALT, ALKPHOS, BILITOT, PROT, ALBUMIN in the last 168 hours. No results for input(s): LIPASE, AMYLASE in the last 168 hours. No results for input(s): AMMONIA in the last 168 hours. CBC: No results for input(s): WBC, NEUTROABS, HGB, HCT, MCV, PLT in the last 168 hours. Cardiac Enzymes: No results for input(s): CKTOTAL, CKMB, CKMBINDEX, TROPONINI in the last 168 hours. BNP: Invalid input(s): POCBNP CBG: No results for input(s): GLUCAP in the last 168 hours. D-Dimer No results for input(s): DDIMER in the last 72 hours. Hgb A1c No results for input(s): HGBA1C in the last 72 hours. Lipid Profile No results for input(s):  CHOL, HDL, LDLCALC, TRIG, CHOLHDL, LDLDIRECT in the last 72 hours. Thyroid function studies No results for input(s): TSH, T4TOTAL, T3FREE, THYROIDAB in the last 72 hours.  Invalid input(s): FREET3 Anemia work up No results for input(s): VITAMINB12, FOLATE, FERRITIN, TIBC, IRON, RETICCTPCT in the last 72 hours. Urinalysis    Component Value Date/Time   COLORURINE AMBER (A) 07/18/2018 0137   APPEARANCEUR CLEAR 07/18/2018 0137   LABSPEC 1.030 07/18/2018 0137   PHURINE 5.0 07/18/2018 9862  GLUCOSEU NEGATIVE 07/18/2018 0137   HGBUR NEGATIVE 07/18/2018 0137   BILIRUBINUR SMALL (A) 07/18/2018 0137   KETONESUR 5 (A) 07/18/2018 0137   PROTEINUR 30 (A) 07/18/2018 0137   NITRITE NEGATIVE 07/18/2018 0137   LEUKOCYTESUR NEGATIVE 07/18/2018 0137   Sepsis Labs No results for input(s): WBC in the last 168 hours.  Invalid input(s): PROCALCITONIN, LACTICIDVEN Microbiology Recent Results (from the past 240 hours)  Culture, blood (Routine X 2) w Reflex to ID Panel     Status: None   Collection Time: 02/15/23 10:12 AM   Specimen: BLOOD LEFT ARM  Result Value Ref Range Status   Specimen Description   Final    BLOOD LEFT ARM Performed at [facility], [address], [place], [place] [address]    Special Requests   Final    BOTTLES DRAWN AEROBIC AND ANAEROBIC Blood Culture results may not be optimal due to an inadequate volume of blood received in culture bottles Performed at [facility], [address] [place], [place] [address]    Culture   Final    NO GROWTH 5 DAYS Performed at [facility], [address], [place], [place] [address]    Report Status 02/20/2023 FINAL  Final  Culture, blood (Routine X 2) w Reflex to ID Panel     Status: None   Collection Time: 02/15/23 10:17 AM   Specimen: BLOOD LEFT HAND  Result Value Ref Range Status   Specimen Description   Final    BLOOD LEFT HAND Performed at [facility], [address] [place], [place] [address]    Special Requests   Final    BOTTLES DRAWN AEROBIC ONLY Blood Culture results may not be optimal due to an inadequate volume of blood received in culture bottles Performed at [facility], [address] [place], [place] [address]    Culture   Final    NO GROWTH 5 DAYS Performed at [facility], [address], [place], [place] [address]    Report Status 02/20/2023 FINAL  Final  Fungus Culture With Stain     Status: None (Preliminary result)   Collection Time: 02/18/23  6:23 PM   Specimen: Arm, Right; Abscess  Result Value Ref Range Status   Fungus Stain Final report  Final    Comment: (NOTE) Performed At: [facility] [address] [place], [place] [id number] Jennette Shorter MD Ey:[id number]    Fungus (Mycology) Culture PENDING  Incomplete   Fungal Source ARM  Final    Comment: Performed at [facility], [address], [place], [place] [address]  Aerobic/Anaerobic Culture w Gram Stain (surgical/deep wound)     Status: None   Collection Time: 02/18/23  6:23 PM   Specimen: Arm, Right; Abscess  Result Value Ref Range Status   Specimen Description ABSCESS  Final   Special Requests RIGHT ARM  Final   Gram Stain   Final    FEW WBC PRESENT, PREDOMINANTLY PMN NO ORGANISMS SEEN    Culture   Final    No growth aerobically or anaerobically. Performed at [facility], [address] [place], [place] [address]    Report Status 02/23/2023 FINAL  Final  Fungus Culture Result     Status: None   Collection Time: 02/18/23  6:23 PM  Result Value Ref Range Status   Result 1 Comment  Final    Comment: (NOTE) KOH/Calcofluor preparation:  no fungus observed.  Performed At: [facility] [address] [place], [place] [id number] Jennette Shorter MD Ey:[id number]      Time coordinating discharge: 0 minutes  SIGNED:   Renato Applebaum, MD  [facility] 02/24/2023,  7:33 AM

## 2023-03-20 LAB — FUNGUS CULTURE WITH STAIN

## 2023-03-20 LAB — FUNGAL ORGANISM REFLEX

## 2023-03-20 LAB — FUNGUS CULTURE RESULT

## 2023-03-28 ENCOUNTER — Other Ambulatory Visit: Payer: Self-pay

## 2023-03-28 ENCOUNTER — Emergency Department (HOSPITAL_COMMUNITY): Payer: 59

## 2023-03-28 ENCOUNTER — Emergency Department (HOSPITAL_COMMUNITY)
Admission: EM | Admit: 2023-03-28 | Discharge: 2023-03-28 | Disposition: A | Discharge status: Home / Self Care | Payer: 59 | Attending: Student | Admitting: Student

## 2023-03-28 ENCOUNTER — Encounter (HOSPITAL_COMMUNITY): Payer: Self-pay

## 2023-03-28 ENCOUNTER — Other Ambulatory Visit (HOSPITAL_COMMUNITY): Payer: Self-pay

## 2023-03-28 DIAGNOSIS — M79672 Pain in left foot: Secondary | ICD-10-CM | POA: Diagnosis present

## 2023-03-28 DIAGNOSIS — L03116 Cellulitis of left lower limb: Secondary | ICD-10-CM | POA: Diagnosis not present

## 2023-03-28 LAB — BASIC METABOLIC PANEL
Anion gap: 10 (ref 5–15)
BUN: 9 mg/dL (ref 6–20)
CO2: 25 mmol/L (ref 22–32)
Calcium: 8.7 mg/dL — ABNORMAL LOW (ref 8.9–10.3)
Chloride: 97 mmol/L — ABNORMAL LOW (ref 98–111)
Creatinine, Ser: 0.71 mg/dL (ref 0.61–1.24)
GFR, Estimated: >60 mL/min (ref >60)
Glucose, Bld: 173 mg/dL — ABNORMAL HIGH (ref 70–99)
Potassium: 3.5 mmol/L (ref 3.5–5.1)
Sodium: 132 mmol/L — ABNORMAL LOW (ref 135–145)

## 2023-03-28 LAB — CBC
HCT: 41.3 % (ref 39.0–52.0)
Hemoglobin: 13.8 g/dL (ref 13.0–17.0)
MCH: 29.7 pg (ref 26.0–34.0)
MCHC: 33.4 g/dL (ref 30.0–36.0)
MCV: 88.8 fL (ref 80.0–100.0)
Platelets: 195 10*3/uL (ref 150–400)
RBC: 4.65 MIL/uL (ref 4.22–5.81)
RDW: 15 % (ref 11.5–15.5)
WBC: 13.1 10*3/uL — ABNORMAL HIGH (ref 4.0–10.5)
nRBC: 0 % (ref 0.0–0.2)

## 2023-03-28 MED ORDER — LIDOCAINE HCL (PF) 1 % IJ SOLN
INTRAMUSCULAR | Status: AC
  Administered 2023-03-28: 5 mL
  Filled 2023-03-28: qty 5

## 2023-03-28 MED ORDER — SULFAMETHOXAZOLE-TRIMETHOPRIM 800-160 MG PO TABS: 1.0000 | ORAL_TABLET | Freq: Once | ORAL | Status: DC

## 2023-03-28 MED ORDER — SULFAMETHOXAZOLE-TRIMETHOPRIM 800-160 MG PO TABS
1.0000 | ORAL_TABLET | Freq: Once | ORAL | Status: DC
  Filled 2023-03-28: qty 1

## 2023-03-28 MED ORDER — CEFTRIAXONE SODIUM 500 MG IJ SOLR
500.0000 mg | Freq: Once | INTRAMUSCULAR | Status: AC
  Administered 2023-03-28: 500 mg via INTRAMUSCULAR
  Filled 2023-03-28: qty 500

## 2023-03-28 MED ORDER — DOXYCYCLINE HYCLATE 100 MG PO TABS
100.0000 mg | ORAL_TABLET | Freq: Two times a day (BID) | ORAL | 0 refills | Status: DC
Start: 2023-03-28 — End: 2023-03-31
  Filled 2023-03-28: qty 20, 10d supply, fill #0

## 2023-03-28 NOTE — ED Triage Notes (Signed)
 Pt BIB EMS with c/o left foot pain from a blister on his foot. Pt reports he has been walking around a lot and that has caused the blister. Pt has hx of Iv drug use, but denies recent drug use. Pt has swelling and redness on left foot. Pt denies fevers.

## 2023-03-28 NOTE — Discharge Planning (Signed)
 RNCM consulted regarding medication assistance of pt unable to afford medications.  RNCM will utilize [facility] petty cash to cover co-pay.  Rx e-scribed to [facility] and delivered to pt prior to discharge home.  No further RNCM needs identified at this time.

## 2023-03-28 NOTE — ED Provider Notes (Signed)
 [place] EMERGENCY DEPARTMENT AT  HOSPITAL Provider Note   CSN: [id number] Arrival date & time: 03/28/23  0243     History  Chief Complaint  Patient presents with   Foot Pain    Thomas Walter is a 28 y.o. male.  This is a 28 year old male here today for left foot pain.  Patient says that he has had swelling and redness in this foot for the last 2 days.  Denies fever.  Patient currently living on the street, has a history of polysubstance use.   Foot Pain       Home Medications Prior to Admission medications   Medication Sig Start Date End Date Taking? Authorizing Provider  doxycycline  (VIBRA -TABS) 100 MG tablet Take 1 tablet (100 mg total) by mouth 2 (two) times daily. 03/28/23  Yes Mannie Pac T, DO  amphetamine -dextroamphetamine (ADDERALL XR) 20 MG 24 hr capsule Take 1 capsule (20 mg total) by mouth every morning. 01/01/13 11/13/18  Lalonde, John C, MD      Allergies    Amoxicillin -pot clavulanate    Review of Systems   Review of Systems  Physical Exam Updated Vital Signs BP 113/71   Pulse 81   Temp 98.5 F (36.9 C)   Resp 15   Wt 68 kg   SpO2 99%   BMI 20.92 kg/m  Physical Exam Vitals reviewed.  Cardiovascular:     Rate and Rhythm: Normal rate.  Pulmonary:     Effort: Pulmonary effort is normal.  Musculoskeletal:     Comments: No swelling of the calf, ankle.  Swelling of the left foot.  Skin:    General: Skin is warm.     Findings: Erythema present.  Neurological:     Mental Status: He is alert.     ED Results / Procedures / Treatments   Labs (all labs ordered are listed, but only abnormal results are displayed) Labs Reviewed  CBC - Abnormal; Notable for the following components:      Result Value   WBC 13.1 (*)    All other components within normal limits  BASIC METABOLIC PANEL - Abnormal; Notable for the following components:   Sodium 132 (*)    Chloride 97 (*)    Glucose, Bld 173 (*)    Calcium 8.7 (*)    All other  components within normal limits    EKG None  Radiology DG Foot Complete Left Result Date: 03/28/2023 CLINICAL DATA:  Foot pain EXAM: LEFT FOOT - COMPLETE 3+ VIEW COMPARISON:  None Available. FINDINGS: There is no evidence of fracture or dislocation. There is no evidence of arthropathy or other focal bone abnormality. Mild anterior plantar soft tissue swelling. IMPRESSION: Mild anterior plantar soft tissue swelling without fracture or dislocation. Electronically Signed   By: Franky Stanford M.D.   On: 03/28/2023 03:54    Procedures Procedures    Medications Ordered in ED Medications  cefTRIAXone  (ROCEPHIN ) injection 500 mg (500 mg Intramuscular Given 03/28/23 0916)  lidocaine  (PF) (XYLOCAINE ) 1 % injection (5 mLs  Given 03/28/23 0916)    ED Course/ Medical Decision Making/ A&P                                 Medical Decision Making 28 year old male here today with foot pain.  Differential diagnose include cellulitis, less likely necrotizing soft tissue infection, less likely DVT.  Plan-patient's foot is consistent with cellulitis.  There is no  crepitus, no blistering present.  Do not believe this represents a necrotizing soft tissue infection.  Patient afebrile.  Exam less consistent with DVT.  Provided the patient with IM Rocephin  here.  Have reached out to social work to see if we can get the patient his prescriptions here in the emergency room.  This patient's health care is complicated by the following social determinants of health-housing insecurity.  945 AM-was able to give the patient IM Rocephin , social work is bringing down prescription for Doxy doxycycline  for the patient.  Elected doxycycline  for broad coverage and 2 times per day dosing as I believe this will be easier for the patient.  Discussed at length with the patient how it was very important that he take these antibiotics and that he needed to return to the emergency department if he developed worsening pain, or symptoms do  not begin to improve after a couple of days.  Risk Prescription drug management.           Final Clinical Impression(s) / ED Diagnoses Final diagnoses:  Cellulitis of foot, left    Rx / DC Orders ED Discharge Orders          Ordered    doxycycline  (VIBRA -TABS) 100 MG tablet  2 times daily        03/28/23 0911              Mannie Pac T, DO 03/28/23 [phone]

## 2023-03-28 NOTE — ED Provider Triage Note (Signed)
 Emergency Medicine Provider Triage Evaluation Note  Thomas Walter , a 28 y.o. male  was evaluated in triage.  Pt complains of painful left foot.  Patient with history of IV drug use, denying recent drug use.  Has swelling and redness noted.  Denies known fever.  He does endorse walking more than usual which may have caused irritation or blister  Review of Systems  Positive:  Negative:   Physical Exam  BP 123/76 (BP Location: Left Arm)   Pulse 87   Temp 98.7 F (37.1 C) (Oral)   Resp 17   Wt 68 kg   SpO2 100%   BMI 20.92 kg/m  Gen:   Awake, no distress   Resp:  Normal effort  MSK:   Moves extremities without difficulty  Other:    Medical Decision Making  Medically screening exam initiated at 3:21 AM.  Appropriate orders placed.  Thomas Walter was informed that the remainder of the evaluation will be completed by another provider, this initial triage assessment does not replace that evaluation, and the importance of remaining in the ED until their evaluation is complete.     Logan Ubaldo NOVAK, PA-C 03/28/23 [phone]

## 2023-03-28 NOTE — ED Notes (Signed)
 Given a happy meal with Ginger Ale

## 2023-03-28 NOTE — Discharge Instructions (Addendum)
 You have an infection in your left foot.  You received some antibiotics here in the emergency room, but it is very important that you take your doxycycline  2 times per day for the next 10 days.  Return to the emergency room if you do not notice an improvement in your pain after 2 days, he feels it is getting worse, develop fever, or develop pain higher up in your leg.

## 2023-03-31 ENCOUNTER — Encounter (HOSPITAL_BASED_OUTPATIENT_CLINIC_OR_DEPARTMENT_OTHER): Payer: Self-pay

## 2023-03-31 ENCOUNTER — Other Ambulatory Visit: Payer: Self-pay

## 2023-03-31 ENCOUNTER — Emergency Department (HOSPITAL_BASED_OUTPATIENT_CLINIC_OR_DEPARTMENT_OTHER)
Admission: EM | Admit: 2023-03-31 | Discharge: 2023-03-31 | Disposition: A | Discharge status: Home / Self Care | Payer: 59 | Attending: Emergency Medicine | Admitting: Emergency Medicine

## 2023-03-31 DIAGNOSIS — M7989 Other specified soft tissue disorders: Secondary | ICD-10-CM | POA: Diagnosis not present

## 2023-03-31 DIAGNOSIS — L03116 Cellulitis of left lower limb: Secondary | ICD-10-CM

## 2023-03-31 DIAGNOSIS — M79672 Pain in left foot: Secondary | ICD-10-CM | POA: Insufficient documentation

## 2023-03-31 LAB — CBC WITH DIFFERENTIAL/PLATELET
Abs Immature Granulocytes: 0.02 10*3/uL (ref 0.00–0.07)
Basophils Absolute: 0 10*3/uL (ref 0.0–0.1)
Basophils Relative: 0 %
Eosinophils Absolute: 0.2 10*3/uL (ref 0.0–0.5)
Eosinophils Relative: 3 %
HCT: 42.9 % (ref 39.0–52.0)
Hemoglobin: 14.3 g/dL (ref 13.0–17.0)
Immature Granulocytes: 0 %
Lymphocytes Relative: 20 %
Lymphs Abs: 1.2 10*3/uL (ref 0.7–4.0)
MCH: 29.4 pg (ref 26.0–34.0)
MCHC: 33.3 g/dL (ref 30.0–36.0)
MCV: 88.3 fL (ref 80.0–100.0)
Monocytes Absolute: 0.7 10*3/uL (ref 0.1–1.0)
Monocytes Relative: 11 %
Neutro Abs: 4 10*3/uL (ref 1.7–7.7)
Neutrophils Relative %: 66 %
Platelets: 196 10*3/uL (ref 150–400)
RBC: 4.86 MIL/uL (ref 4.22–5.81)
RDW: 14.6 % (ref 11.5–15.5)
WBC: 6.1 10*3/uL (ref 4.0–10.5)
nRBC: 0 % (ref 0.0–0.2)

## 2023-03-31 LAB — BASIC METABOLIC PANEL
Anion gap: 7 (ref 5–15)
BUN: 11 mg/dL (ref 6–20)
CO2: 29 mmol/L (ref 22–32)
Calcium: 9 mg/dL (ref 8.9–10.3)
Chloride: 97 mmol/L — ABNORMAL LOW (ref 98–111)
Creatinine, Ser: 0.66 mg/dL (ref 0.61–1.24)
GFR, Estimated: >60 mL/min (ref >60)
Glucose, Bld: 95 mg/dL (ref 70–99)
Potassium: 4.5 mmol/L (ref 3.5–5.1)
Sodium: 133 mmol/L — ABNORMAL LOW (ref 135–145)

## 2023-03-31 LAB — LACTIC ACID, PLASMA: Lactic Acid, Venous: 0.9 mmol/L (ref 0.5–1.9)

## 2023-03-31 MED ORDER — DOXYCYCLINE HYCLATE 100 MG PO TABS: 100.0000 mg | ORAL_TABLET | Freq: Two times a day (BID) | ORAL | 0 refills | Status: DC

## 2023-03-31 MED ORDER — CEPHALEXIN 250 MG PO CAPS
500.0000 mg | ORAL_CAPSULE | Freq: Once | ORAL | Status: AC
  Administered 2023-03-31: 500 mg via ORAL
  Filled 2023-03-31: qty 2

## 2023-03-31 MED ORDER — CEPHALEXIN 500 MG PO CAPS: 500.0000 mg | ORAL_CAPSULE | Freq: Four times a day (QID) | ORAL | 0 refills | Status: DC

## 2023-03-31 MED ORDER — ACETAMINOPHEN 500 MG PO TABS
1000.0000 mg | ORAL_TABLET | Freq: Once | ORAL | Status: AC
  Administered 2023-03-31: 1000 mg via ORAL
  Filled 2023-03-31: qty 2

## 2023-03-31 MED ORDER — AMOXICILLIN-POT CLAVULANATE 875-125 MG PO TABS: 1.0000 | ORAL_TABLET | Freq: Once | ORAL | Status: DC

## 2023-03-31 MED ORDER — KETOROLAC TROMETHAMINE 15 MG/ML IJ SOLN
30.0000 mg | Freq: Once | INTRAMUSCULAR | Status: AC
  Administered 2023-03-31: 30 mg via INTRAMUSCULAR
  Filled 2023-03-31: qty 2

## 2023-03-31 NOTE — Discharge Instructions (Addendum)
 Overall feel that the antibiotics are working for your cellulitis but given that your foot is still red and swollen and tender to touch, I have extended your doxycycline  and added Keflex .  If you develop a fever, if the swelling or redness becomes worse or starts to track up your leg, if you develop chest pain or shortness of breath or any other concerning symptom please return emerged part further evaluation.

## 2023-03-31 NOTE — ED Triage Notes (Signed)
 Pt states he was at [facility] for cellulitis of L foot, advises compliance w PO abx, "now it's come to a head & it's miserable."

## 2023-03-31 NOTE — ED Provider Notes (Signed)
 [place] EMERGENCY DEPARTMENT AT [facility] Provider Note   CSN: [id number] Arrival date & time: 03/31/23  1849     History  No chief complaint on file.  HPI Thomas Walter is a 28 y.o. 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  States he is still using IV drugs but does not shoot into his foot.  Seen at [facility] 3 days ago.  Started on doxycycline .  States he has been taking the antibiotic as prescribed and overall the swelling has improved but pain is still bothersome. Denies chest pain and SOB. Denies calf tenderness.   HPI     Home Medications Prior to Admission medications   Medication Sig Start Date End Date Taking? Authorizing Provider  cephALEXin  (KEFLEX ) 500 MG capsule Take 1 capsule (500 mg total) by mouth 4 (four) times daily. 03/31/23  Yes Lang Norleen POUR, PA-C  doxycycline  (VIBRA -TABS) 100 MG tablet Take 1 tablet (100 mg total) by mouth 2 (two) times daily. 03/31/23   Corda Shutt K, PA-C  amphetamine -dextroamphetamine (ADDERALL XR) 20 MG 24 hr capsule Take 1 capsule (20 mg total) by mouth every morning. 01/01/13 11/13/18  Joyce Norleen BROCKS, MD      Allergies    Amoxicillin -pot clavulanate    Review of Systems   See HPI for pertinent positives   Physical Exam   Vitals:   03/31/23 1856  BP: 123/82  Pulse: 93  Resp: 16  Temp: 98.2 F (36.8 C)  SpO2: 100%    CONSTITUTIONAL:  well-appearing, NAD NEURO:  Alert and oriented x 3, CN 3-12 grossly intact EYES:  eyes equal and reactive ENT/NECK:  Supple, no stridor  CARDIO:  regular rate and rhythm, appears well-perfused  PULM:  No respiratory distress,  GI/GU:  non-distended, soft MSK/SPINE: No gross abnormalities.  Left foot erythema and edema noted.  Generally tender with palpation.  No streaking up the leg.  No track marks noted in the area around the foot. SKIN:  no rash,  atraumatic       *Additional and/or pertinent findings included in MDM below   ED Results / Procedures / Treatments   Labs (all labs ordered are listed, but only abnormal results are displayed) Labs Reviewed  BASIC METABOLIC PANEL - Abnormal; Notable for the following components:      Result Value   Sodium 133 (*)    Chloride 97 (*)    All other components within normal limits  LACTIC ACID, PLASMA  CBC WITH DIFFERENTIAL/PLATELET  LACTIC ACID, PLASMA    EKG None  Radiology No results found.  Procedures Procedures    Medications Ordered in ED Medications  cephALEXin  (KEFLEX ) capsule 500 mg (has no administration in time range)  acetaminophen  (TYLENOL ) tablet 1,000 mg (1,000 mg Oral Given 03/31/23 2044)  ketorolac  (TORADOL ) 15 MG/ML injection 30 mg (30 mg Intramuscular Given 03/31/23 2145)    ED Course/ Medical Decision Making/ A&P                                 Medical Decision Making Amount and/or Complexity of Data Reviewed Labs: ordered.  Risk OTC drugs.   28 year old well-appearing male presenting for left foot pain in setting of recent diagnosis of cellulitis.  Exam notable for generalized erythema and edema along with tenderness in the left foot.  Patient states that  overall symptoms have improved since he started doxycycline  3 days ago.  Vitals here are normal without a fever.  I personally reviewed and interpreted labs as well with no acute derangements.  Does not appear to be septic.  Suspect that the antibiotics are working.  I extended his course of doxycycline  and added Keflex  to include more gram-negative coverage.  Also advised supportive treatment along with Tylenol  ibuprofen  for pain.  Discussed case with Dr. Dreama who was agreeable to this plan. Advised him to follow-up with his PCP.  Discussed strict return precautions.  Discharged in good condition.  Also considered DVT but unlikely given no calf tenderness on exam and erythema and edema or  limited to the foot.        Final Clinical Impression(s) / ED Diagnoses Final diagnoses:  Left foot pain    Rx / DC Orders ED Discharge Orders          Ordered    doxycycline  (VIBRA -TABS) 100 MG tablet  2 times daily        03/31/23 2201    cephALEXin  (KEFLEX ) 500 MG capsule  4 times daily        03/31/23 2203              Lang Norleen POUR, PA-C 03/31/23 2209    Dreama Longs, MD 04/02/23 1049

## 2023-05-14 ENCOUNTER — Other Ambulatory Visit: Payer: Self-pay

## 2023-05-14 ENCOUNTER — Ambulatory Visit (HOSPITAL_COMMUNITY): Admission: RE | Admit: 2023-05-14 | Source: Ambulatory Visit

## 2023-05-14 ENCOUNTER — Encounter (HOSPITAL_COMMUNITY): Payer: Self-pay | Admitting: Emergency Medicine

## 2023-05-14 ENCOUNTER — Emergency Department (HOSPITAL_COMMUNITY)
Admission: EM | Admit: 2023-05-14 | Discharge: 2023-05-14 | Discharge status: Against Medical Advice | Payer: MEDICAID | Attending: Emergency Medicine | Admitting: Emergency Medicine

## 2023-05-14 DIAGNOSIS — Z041 Encounter for examination and observation following transport accident: Secondary | ICD-10-CM | POA: Diagnosis not present

## 2023-05-14 DIAGNOSIS — F10129 Alcohol abuse with intoxication, unspecified: Secondary | ICD-10-CM | POA: Diagnosis present
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
## 2023-06-05 ENCOUNTER — Emergency Department (HOSPITAL_COMMUNITY): Payer: MEDICAID

## 2023-06-05 ENCOUNTER — Inpatient Hospital Stay (HOSPITAL_COMMUNITY)
Admission: EM | Admit: 2023-06-05 | Discharge: 2023-06-05 | DRG: 603 | Discharge status: Against Medical Advice | Payer: MEDICAID | Attending: Internal Medicine | Admitting: Internal Medicine

## 2023-06-05 ENCOUNTER — Encounter (HOSPITAL_COMMUNITY): Payer: Self-pay | Admitting: Internal Medicine

## 2023-06-05 ENCOUNTER — Other Ambulatory Visit: Payer: Self-pay

## 2023-06-05 DIAGNOSIS — F1110 Opioid abuse, uncomplicated: Secondary | ICD-10-CM | POA: Diagnosis present

## 2023-06-05 DIAGNOSIS — L02512 Cutaneous abscess of left hand: Secondary | ICD-10-CM | POA: Diagnosis present

## 2023-06-05 DIAGNOSIS — L03114 Cellulitis of left upper limb: Principal | ICD-10-CM

## 2023-06-05 DIAGNOSIS — E441 Mild protein-calorie malnutrition: Secondary | ICD-10-CM | POA: Diagnosis present

## 2023-06-05 DIAGNOSIS — F988 Other specified behavioral and emotional disorders with onset usually occurring in childhood and adolescence: Secondary | ICD-10-CM | POA: Diagnosis present

## 2023-06-05 DIAGNOSIS — F1990 Other psychoactive substance use, unspecified, uncomplicated: Secondary | ICD-10-CM | POA: Diagnosis present

## 2023-06-05 LAB — COMPREHENSIVE METABOLIC PANEL WITH GFR
ALT: 21 U/L (ref 0–44)
AST: 20 U/L (ref 15–41)
Albumin: 3.4 g/dL — ABNORMAL LOW (ref 3.5–5.0)
Alkaline Phosphatase: 61 U/L (ref 38–126)
Anion gap: 7 (ref 5–15)
BUN: 7 mg/dL (ref 6–20)
CO2: 26 mmol/L (ref 22–32)
Calcium: 9 mg/dL (ref 8.9–10.3)
Chloride: 102 mmol/L (ref 98–111)
Creatinine, Ser: 0.57 mg/dL — ABNORMAL LOW (ref 0.61–1.24)
GFR, Estimated: >60 mL/min (ref >60)
Glucose, Bld: 136 mg/dL — ABNORMAL HIGH (ref 70–99)
Potassium: 4.1 mmol/L (ref 3.5–5.1)
Sodium: 135 mmol/L (ref 135–145)
Total Bilirubin: 0.8 mg/dL (ref 0.0–1.2)
Total Protein: 7 g/dL (ref 6.5–8.1)

## 2023-06-05 LAB — CBC WITH DIFFERENTIAL/PLATELET
Abs Immature Granulocytes: 0.03 10*3/uL (ref 0.00–0.07)
Basophils Absolute: 0 10*3/uL (ref 0.0–0.1)
Basophils Relative: 0 %
Eosinophils Absolute: 0.1 10*3/uL (ref 0.0–0.5)
Eosinophils Relative: 1 %
HCT: 40.8 % (ref 39.0–52.0)
Hemoglobin: 13 g/dL (ref 13.0–17.0)
Immature Granulocytes: 0 %
Lymphocytes Relative: 12 %
Lymphs Abs: 1.1 10*3/uL (ref 0.7–4.0)
MCH: 29.5 pg (ref 26.0–34.0)
MCHC: 31.9 g/dL (ref 30.0–36.0)
MCV: 92.5 fL (ref 80.0–100.0)
Monocytes Absolute: 0.6 10*3/uL (ref 0.1–1.0)
Monocytes Relative: 6 %
Neutro Abs: 7.6 10*3/uL (ref 1.7–7.7)
Neutrophils Relative %: 81 %
Platelets: 242 10*3/uL (ref 150–400)
RBC: 4.41 MIL/uL (ref 4.22–5.81)
RDW: 14 % (ref 11.5–15.5)
WBC: 9.5 10*3/uL (ref 4.0–10.5)
nRBC: 0 % (ref 0.0–0.2)

## 2023-06-05 LAB — LACTIC ACID, PLASMA: Lactic Acid, Venous: 0.8 mmol/L (ref 0.5–1.9)

## 2023-06-05 MED ORDER — LIDOCAINE-EPINEPHRINE (PF) 2 %-1:200000 IJ SOLN
20.0000 mL | Freq: Once | INTRAMUSCULAR | Status: AC
  Administered 2023-06-05: 20 mL
  Filled 2023-06-05: qty 20

## 2023-06-05 MED ORDER — ONDANSETRON HCL 4 MG/2ML IJ SOLN: 4.0000 mg | Freq: Four times a day (QID) | INTRAMUSCULAR | Status: DC | PRN

## 2023-06-05 MED ORDER — VANCOMYCIN HCL IN DEXTROSE 1-5 GM/200ML-% IV SOLN
1000.0000 mg | Freq: Once | INTRAVENOUS | Status: AC
  Administered 2023-06-05: 1000 mg via INTRAVENOUS
  Filled 2023-06-05: qty 200

## 2023-06-05 MED ORDER — SODIUM CHLORIDE 0.9 % IV SOLN
2.0000 g | Freq: Once | INTRAVENOUS | Status: AC
  Administered 2023-06-05: 2 g via INTRAVENOUS
  Filled 2023-06-05: qty 12.5

## 2023-06-05 MED ORDER — VANCOMYCIN HCL IN DEXTROSE 1-5 GM/200ML-% IV SOLN
1000.0000 mg | Freq: Once | INTRAVENOUS | Status: AC
Start: 2023-06-05 — End: 2023-06-05
  Administered 2023-06-05: 1000 mg via INTRAVENOUS
  Filled 2023-06-05: qty 200

## 2023-06-05 MED ORDER — MORPHINE SULFATE (PF) 4 MG/ML IV SOLN
4.0000 mg | Freq: Once | INTRAVENOUS | Status: AC
  Administered 2023-06-05: 4 mg via INTRAVENOUS
  Filled 2023-06-05: qty 1

## 2023-06-05 MED ORDER — VANCOMYCIN HCL 1250 MG/250ML IV SOLN
1250.0000 mg | Freq: Two times a day (BID) | INTRAVENOUS | Status: DC
  Filled 2023-06-05: qty 250

## 2023-06-05 MED ORDER — TRAMADOL HCL 50 MG PO TABS: 50.0000 mg | ORAL_TABLET | Freq: Three times a day (TID) | ORAL | Status: DC | PRN

## 2023-06-05 MED ORDER — SODIUM CHLORIDE 0.9 % IV SOLN
1.0000 g | INTRAVENOUS | Status: DC
  Administered 2023-06-05: 1 g via INTRAVENOUS
  Filled 2023-06-05: qty 10

## 2023-06-05 MED ORDER — LACTATED RINGERS IV BOLUS
1000.0000 mL | Freq: Once | INTRAVENOUS | Status: AC
  Administered 2023-06-05: 1000 mL via INTRAVENOUS

## 2023-06-05 MED ORDER — KETOROLAC TROMETHAMINE 30 MG/ML IJ SOLN
30.0000 mg | Freq: Four times a day (QID) | INTRAMUSCULAR | Status: DC | PRN
  Administered 2023-06-05: 30 mg via INTRAVENOUS
  Filled 2023-06-05: qty 1

## 2023-06-05 MED ORDER — ONDANSETRON HCL 4 MG PO TABS: 4.0000 mg | ORAL_TABLET | Freq: Four times a day (QID) | ORAL | Status: DC | PRN

## 2023-06-05 NOTE — ED Provider Notes (Signed)
 [facility]ERGENCY DEPT [facility] Emergency Department Provider Note MRN:  [id number]  Arrival date & time: 06/05/23     Chief Complaint   Hand Swelling   History of Present Illness   Thomas Walter is a 28 y.o. year-old male presents to the ED with chief complaint of left hand pain and swelling.  He states that his symptoms started 3 days ago.  He thinks that he used a dirty needle. He states that he isn't able to flex or extend his fingers due to swelling and pain.  He reports fevers at home.  He states that he has pain and swelling moving up his forearm.  History provided by patient.   Review of Systems  Pertinent positive and negative review of systems noted in HPI.    Physical Exam   Vitals:   06/05/23 0427  BP: 109/71  Pulse: 96  Resp: 17  Temp: 98 F (36.7 C)  SpO2: 96%    CONSTITUTIONAL:  non toxic-appearing, NAD NEURO:  Alert and oriented x 3, CN 3-12 grossly intact EYES:  eyes equal and reactive ENT/NECK:  Supple, no stridor  CARDIO:  normal rate, regular rhythm, appears well-perfused  PULM:  No respiratory distress, CTAB GI/GU:  non-distended,  MSK/SPINE:  No gross deformities, no edema, moves all extremities  SKIN:  Significant soft tissue swelling to the left hand, there is a draining wound to the posterior hand, there is marked erythema to the posterior hand that extends to the wrist.  There is TTP of the forearm with some associated swelling.        *Additional and/or pertinent findings included in MDM below  Diagnostic and Interventional Summary    EKG Interpretation Date/Time:    Ventricular Rate:    PR Interval:    QRS Duration:    QT Interval:    QTC Calculation:   R Axis:      Text Interpretation:         Labs Reviewed  CULTURE, BLOOD (ROUTINE X 2)  CULTURE, BLOOD (ROUTINE X 2)  BODY FLUID CULTURE W GRAM STAIN  CBC WITH DIFFERENTIAL/PLATELET  LACTIC ACID, PLASMA  COMPREHENSIVE METABOLIC PANEL WITH GFR  LACTIC ACID, PLASMA     DG Hand 2 View Left  Final Result      Medications  vancomycin (VANCOCIN) IVPB 1000 mg/200 mL premix (1,000 mg Intravenous New Bag/Given 06/05/23 0552)  ceFEPIme (MAXIPIME) 2 g in sodium chloride 0.9 % 100 mL IVPB (2 g Intravenous New Bag/Given 06/05/23 0551)  lactated ringers bolus 1,000 mL (1,000 mLs Intravenous New Bag/Given 06/05/23 0521)  lidocaine-EPINEPHrine (XYLOCAINE W/EPI) 2 %-1:200000 (PF) injection 20 mL (20 mLs Infiltration Given by Other 06/05/23 0555)  morphine (PF) 4 MG/ML injection 4 mg (4 mg Intravenous Given 06/05/23 0549)     Procedures  /  Critical Care .Incision and Drainage  Date/Time: 06/05/2023 5:59 AM  Performed by: Roxy Horseman, PA-C Authorized by: Roxy Horseman, PA-C   Consent:    Consent obtained:  Verbal   Consent given by:  Patient   Risks, benefits, and alternatives were discussed: yes     Risks discussed:  Incomplete drainage, infection, pain and bleeding   Alternatives discussed:  Referral, delayed treatment and no treatment Universal protocol:    Procedure explained and questions answered to patient or proxy's satisfaction: yes     Relevant documents present and verified: yes     Test results available : yes     Imaging studies available: yes  Required blood products, implants, devices, and special equipment available: yes     Site/side marked: yes     Immediately prior to procedure, a time out was called: yes     Patient identity confirmed:  Verbally with patient Location:    Type:  Abscess   Size:  4x4cm   Location:  Upper extremity   Upper extremity location:  Hand   Hand location:  L hand Pre-procedure details:    Skin preparation:  Chlorhexidine Sedation:    Sedation type:  None Anesthesia:    Anesthesia method:  Local infiltration   Local anesthetic:  Lidocaine 1% WITH epi Procedure type:    Complexity:  Simple Procedure details:    Incision types:  Single straight   Drainage:  Purulent   Drainage amount:   Scant Post-procedure details:    Procedure completion:  Tolerated well, no immediate complications .Laceration Repair  Date/Time: 06/05/2023 6:05 AM  Performed by: Roxy Horseman, PA-C Authorized by: Roxy Horseman, PA-C   Consent:    Consent obtained:  Verbal   Consent given by:  Patient   Risks, benefits, and alternatives were discussed: yes     Risks discussed:  Infection, pain, poor cosmetic result, poor wound healing and need for additional repair   Alternatives discussed:  No treatment, referral and delayed treatment Universal protocol:    Procedure explained and questions answered to patient or proxy's satisfaction: yes     Relevant documents present and verified: yes     Test results available: yes     Imaging studies available: yes     Required blood products, implants, devices, and special equipment available: yes     Site/side marked: yes     Immediately prior to procedure, a time out was called: yes     Patient identity confirmed:  Verbally with patient Anesthesia:    Anesthesia method:  Local infiltration   Local anesthetic:  Lidocaine 1% WITH epi Laceration details:    Location:  Hand   Hand location:  L hand, dorsum   Length (cm):  2 Treatment:    Area cleansed with:  Chlorhexidine   Amount of cleaning:  Standard Skin repair:    Repair method:  Sutures   Suture size:  4-0   Suture material:  Nylon   Suture technique:  Simple interrupted   Number of sutures:  2 Approximation:    Approximation:  Loose Repair type:    Repair type:  Simple Post-procedure details:    Dressing:  Open (no dressing)   Procedure completion:  Tolerated well, no immediate complications   ED Course and Medical Decision Making  I have reviewed the triage vital signs, the nursing notes, and pertinent available records from the EMR.  Social Determinants Affecting Complexity of Care: Patient has no clinically significant social determinants affecting this chief  complaint..   ED Course:    Medical Decision Making Patient here with swelling to the left hand.  Has associated erythema that is consistent with cellulitis that tracks proximally.  There is associated swelling and pain in the forearm.    I called and discussed with hand surgery, see below.  Will need admission for IV abx.  Hand surgery to see at [facility].  Amount and/or Complexity of Data Reviewed Labs: ordered. Radiology: ordered.  Risk Prescription drug management. Decision regarding hospitalization.         Consultants: I consulted with Dr. Denese Killings, who recommends admission at [facility] for IV abx and hand surgery consultation.  He recommends lancing the small wound and extending slightly distally/proximally for drainage and loose skin closure.  Hand will follow.  I consulted with Dr. Segars, who is appreciated for accepting patient for admission.  Patient needs to be admitted at [facility].  Treatment and Plan: Patient's exam and diagnostic results are concerning for hand cellulitis.  Feel that patient will need admission to the hospital for further treatment and evaluation.  Patient discussed with attending physician, Dr. Monique Ano, who recommends consultation with hand surgery.  Final Clinical Impressions(s) / ED Diagnoses     ICD-10-CM   1. Cellulitis of left upper extremity  L03.114       ED Discharge Orders     None         Discharge Instructions Discussed with and Provided to Patient:   Discharge Instructions   None      Sherel Dikes, PA-C 06/05/23 2841    Kelsey Patricia, MD 06/05/23 0700

## 2023-06-05 NOTE — ED Notes (Signed)
 Pt AMA. Thomas Walter he would come back if things got worse.

## 2023-06-05 NOTE — Progress Notes (Signed)
 Pharmacy Antibiotic Note  Thomas Walter is a 28 y.o. male admitted on 06/05/2023 with c/o left hand pain and swelling for 3 days.  Pharmacy has been consulted to dose vancomycin for cellulitis/wound infection.  Pt received a dose of cefepime 2g IV and 2 doses of vancomycin 1000 mg IV in the ED.  Today, 06/05/2023:   - D1 of abx therapy - WBC WNL 9.5 - Afebrile, T 12F - Scr 0.57  Plan: - Vancomycin 1250 mg IV q12h (eAUC 439.8) - Ceftriaxone 1g q24h  Weight: 68 kg (149 lb 14.6 oz)  Temp (24hrs), Avg:98 F (36.7 C), Min:98 F (36.7 C), Max:98 F (36.7 C)  Recent Labs  Lab 06/05/23 0512 06/05/23 0744  WBC 9.5  --   CREATININE  --  0.57*  LATICACIDVEN 0.8  --     Estimated Creatinine Clearance: 134.6 mL/min (A) (by C-G formula based on SCr of 0.57 mg/dL (L)).    Allergies  Allergen Reactions   Amoxicillin-Pot Clavulanate Nausea And Vomiting    Antimicrobials this admission: Vancomycin 4/16 >>  Cefepime 4/16 x1 Ceftriaxone 4/16 >>  Microbiology results: 4/16 BCx: pending 4/16 abscess Cx: pending   Thank you for allowing pharmacy to be a part of this patient's care.  Dara Ear, PharmD Candidate 06/05/2023 8:48 AM

## 2023-06-05 NOTE — Plan of Care (Signed)
 HAND SURGERY UPDATE NOTE:  Made aware of patient from [facility] ED.  Pictures in chart reviewed as well as XR.  Discussed with PA in ED, recommended bedside I&D for dorsal swelling of hand and possible abscess/collection.  In agreement with plan for admission to medical service for IV antibiotics.  Have recommended transfer to [facility] to medical team for IV abx.  Will plan to see in consult once transferred over.    Thomas Walter

## 2023-06-05 NOTE — ED Notes (Signed)
 [place] PIV placed.

## 2023-06-05 NOTE — Progress Notes (Signed)
 ED Pharmacy Antibiotic Sign Off An antibiotic consult was received from an ED provider for vanc and cefepime per pharmacy dosing for wound infection. A chart review was completed to assess appropriateness.   The following one time order(s) were placed:  Vanc 1gm and cefepime 2gm  Further antibiotic and/or antibiotic pharmacy consults should be ordered by the admitting provider if indicated.   Thank you for allowing pharmacy to be a part of this patient's care.   Donnajean Fuse [facility]  Clinical Pharmacist 06/05/23 5:29 AM

## 2023-06-05 NOTE — Discharge Summary (Signed)
 Physician Discharge Summary   Patient: Thomas Walter MRN: [id number] DOB: 1995/07/23  Admit date:     06/05/2023  Discharge date: 06/05/23  Discharge Physician: Bobette Mo   PCP: Pcp, No   Recommendations at discharge:    Discharge Diagnoses: Assessment and Plan: Principal Problem:   Cellulitis of left hand In the setting of:   Heroin abuse (HCC)   IVDU (intravenous drug user) Admit to medsurg/inpatient. Analgesics as needed. Begin ceftriaxone 1 g IVPB daily. Follow-up blood culture and sensitivity. Transferring to [facility] for hand surgery evaluation. Will consult transition of care team.   Active Problems:   ADD (attention deficit disorder) Not currently on therapy.     Mild protein malnutrition (HCC) In the setting of IVDA/acute infection. May benefit from protein supplementation. Consider nutritional services evaluation. Follow-up albumin level.   Hospital Course: Chief Complaint  Patient presents with   Hand Swelling    HPI: Thomas Walter is a 28 y.o. male with medical history significant of ADD, left varicocele he who presented with a 3-day history of progressively worse edema, erythema and tenderness on his left hand after injecting heroin.  Positive chills, but he denied fever, chills, rhinorrhea, sore throat, wheezing or hemoptysis.  No chest pain, palpitations, diaphoresis, PND, orthopnea or pitting edema of the lower extremities.  No abdominal pain, nausea, emesis, diarrhea, constipation, melena or hematochezia.  No flank pain, dysuria, frequency or hematuria.  No polyuria, polydipsia, polyphagia or blurred vision.    Lab work: CBC and lactic acid were normal.  CMP showed an albumin of 3.4 g/dL, creatinine of 4.09 and glucose 136 mg/dL.  The rest of the CMP measurements were normal.   Imaging: 2 view x-ray showing soft tissue swelling without acute bony findings.  No radiopaque foreign body seen.   ED course: Initial vital signs were temperature 98  F, pulse 96, respiration 17, BP 109/71 and O2 sat 96% on room air.  The patient received cefepime 2 g IVPB, LR 1000 mL liter bolus, vancomycin, SQ lidocaine for I&D and morphine 4 mg IVP. Patient left AMA from the emergency department.  Consultants: Hand surgery. Procedures performed:   Disposition: Home Diet recommendation:  Regular diet DISCHARGE MEDICATION:   Discharge Exam: Filed Weights   06/05/23 0500  Weight: 68 kg   Left before being reevaluated.  Condition at discharge: stable  The results of significant diagnostics from this hospitalization (including imaging, microbiology, ancillary and laboratory) are listed below for reference.   Imaging Studies: DG Hand 2 View Left Result Date: 06/05/2023 CLINICAL DATA:  2 day history of swelling.  No injury. EXAM: LEFT HAND - 2 VIEW COMPARISON:  None Available. FINDINGS: Study limited by superimposition of fingers on both films. Within this limitation there is no evidence for an acute fracture or dislocation. Soft tissue swelling evident. No radiopaque soft tissue foreign body. IMPRESSION: Soft tissue swelling without acute bony findings. No evidence for radiopaque soft tissue foreign body. Electronically Signed   By: Kennith Center M.D.   On: 06/05/2023 05:27    Microbiology: Results for orders placed or performed during the hospital encounter of 06/05/23  Blood culture (routine x 2)     Status: None (Preliminary result)   Collection Time: 06/05/23  5:12 AM   Specimen: BLOOD  Result Value Ref Range Status   Specimen Description   Final    BLOOD BLOOD RIGHT ARM Performed at [facility], [address], [place], [place] [address]  Special Requests   Final    BOTTLES DRAWN AEROBIC AND ANAEROBIC Blood Culture results may not be optimal due to an inadequate volume of blood received in culture bottles Performed at [facility], [address], [place], [place] [address]    Culture   Final     NO GROWTH < 12 HOURS Performed at [facility], [address] [place], [place] [address]    Report Status PENDING  Incomplete  Aerobic Culture w Gram Stain (superficial specimen)     Status: None (Preliminary result)   Collection Time: 06/05/23  7:41 AM   Specimen: Wound  Result Value Ref Range Status   Specimen Description   Final    WOUND Performed at [facility], [address] [place], [place] [address]    Special Requests   Final    Hand Left Performed at [facility], [address], [place], [place] [address]    Gram Stain   Final    NO WBC SEEN NO ORGANISMS SEEN Performed at [facility], [address], [place], [place] [address]    Culture PENDING  Incomplete   Report Status PENDING  Incomplete    Labs: CBC: Recent Labs  Lab 06/05/23 0512  WBC 9.5  NEUTROABS 7.6  HGB 13.0  HCT 40.8  MCV 92.5  PLT 242   Basic Metabolic Panel: Recent Labs  Lab 06/05/23 0744  NA 135  K 4.1  CL 102  CO2 26  GLUCOSE 136*  BUN 7  CREATININE 0.57*  CALCIUM 9.0   Liver Function Tests: Recent Labs  Lab 06/05/23 0744  AST 20  ALT 21  ALKPHOS 61  BILITOT 0.8  PROT 7.0  ALBUMIN 3.4*   Discharge time spent: less than 30 minutes.  Signed: Danice Dural, MD [facility] 06/05/2023  This document was prepared using Dragon voice recognition software and may contain some unintended transcription errors.

## 2023-06-05 NOTE — ED Notes (Signed)
 PA at triage bedside

## 2023-06-05 NOTE — ED Triage Notes (Addendum)
 Pt reports worsening swelling of the left hand over the past 3 days. Has swelling into his forearm. Sts concern for possible "dirty needle" or spider bite. There is significant redness and swelling - unable to move fingers. Reports suspected fevers and chills.   Pt currently under PD custody.

## 2023-06-05 NOTE — H&P (Signed)
 History and Physical    Patient: Thomas Walter GNF:[id number] DOB: 05/12/1995 DOA: 06/05/2023 DOS: the patient was seen and examined on 06/05/2023 PCP: Pcp, No  Patient coming from: Home  Chief Complaint:  Chief Complaint  Patient presents with   Hand Swelling   HPI: Thomas Walter is a 28 y.o. male with medical history significant of ADD, left varicocele he who presented with a 3-day history of progressively worse edema, erythema and tenderness on his left hand after injecting heroin.  Positive chills, but he denied fever, chills, rhinorrhea, sore throat, wheezing or hemoptysis.  No chest pain, palpitations, diaphoresis, PND, orthopnea or pitting edema of the lower extremities.  No abdominal pain, nausea, emesis, diarrhea, constipation, melena or hematochezia.  No flank pain, dysuria, frequency or hematuria.  No polyuria, polydipsia, polyphagia or blurred vision.   Lab work: CBC and lactic acid were normal.  CMP showed an albumin of 3.4 g/dL, creatinine of 8.46 and glucose 136 mg/dL.  The rest of the CMP measurements were normal.  Imaging: 2 view x-ray showing soft tissue swelling without acute bony findings.  No radiopaque foreign body seen.  ED course: Initial vital signs were temperature 98 F, pulse 96, respiration 17, BP 109/71 and O2 sat 96% on room air.  The patient received cefepime 2 g IVPB, LR 1000 mL liter bolus, vancomycin, SQ lidocaine for I&D and morphine 4 mg IVP.   Review of Systems: As mentioned in the history of present illness. All other systems reviewed and are negative. Past Medical History:  Diagnosis Date   ADD (attention deficit disorder with hyperactivity)    Left varicocele    Past Surgical History:  Procedure Laterality Date   INCISION AND DRAINAGE ABSCESS Right 02/18/2023   Procedure: INCISION AND DRAINAGE ABSCESS RIGHT ARM ABSCESS;  Surgeon: Gaylon Kea, MD;  Location: [facility]S;  Service: Orthopedics;  Laterality: Right;  placed open penrose drain    MYRINGOTOMY     CROSSLEY   Social History:  reports that he has never smoked. He has never used smokeless tobacco. He reports current drug use. Drug: IV. He reports that he does not drink alcohol.  Allergies  Allergen Reactions   Amoxicillin-Pot Clavulanate Nausea And Vomiting    No family history on file.  Prior to Admission medications   Medication Sig Start Date End Date Taking? Authorizing Provider  cephALEXin (KEFLEX) 500 MG capsule Take 1 capsule (500 mg total) by mouth 4 (four) times daily. 03/31/23   Robinson, John K, PA-C  doxycycline (VIBRA-TABS) 100 MG tablet Take 1 tablet (100 mg total) by mouth 2 (two) times daily. 03/31/23   Robinson, John K, PA-C  amphetamine-dextroamphetamine (ADDERALL XR) 20 MG 24 hr capsule Take 1 capsule (20 mg total) by mouth every morning. 01/01/13 11/13/18  Watson Hacking, MD    Physical Exam: Vitals:   06/05/23 0427 06/05/23 0500 06/05/23 0806  BP: 109/71  109/71  Pulse: 96  96  Resp: 17  17  Temp: 98 F (36.7 C)  98 F (36.7 C)  TempSrc: Oral  Oral  SpO2: 96%  96%  Weight:  68 kg    Physical Exam Vitals and nursing note reviewed.  Constitutional:      General: He is awake. He is not in acute distress.    Appearance: He is ill-appearing.  HENT:     Head: Normocephalic.     Nose: No rhinorrhea.     Mouth/Throat:     Mouth: Mucous membranes are moist.  Eyes:     General: No scleral icterus.    Pupils: Pupils are equal, round, and reactive to light.  Neck:     Vascular: No JVD.  Cardiovascular:     Rate and Rhythm: Normal rate and regular rhythm.     Heart sounds: S1 normal and S2 normal.  Pulmonary:     Effort: Pulmonary effort is normal.     Breath sounds: Normal breath sounds. No wheezing, rhonchi or rales.  Abdominal:     General: Bowel sounds are normal. There is no distension.     Palpations: Abdomen is soft.     Tenderness: There is no abdominal tenderness. There is no right CVA tenderness, left CVA tenderness or  guarding.  Musculoskeletal:     Cervical back: Neck supple.     Right lower leg: No edema.     Left lower leg: No edema.  Skin:    General: Skin is warm and dry.     Findings: Erythema present.  Neurological:     General: No focal deficit present.     Mental Status: He is alert and oriented to person, place, and time.  Psychiatric:        Mood and Affect: Mood normal.        Behavior: Behavior normal. Behavior is cooperative.          Data Reviewed:  Results are pending, will review when available.  Assessment and Plan: Principal Problem:   Cellulitis of left hand In the setting of:   Heroin abuse (HCC)   IVDU (intravenous drug user) Admit to medsurg/inpatient. Analgesics as needed. Begin ceftriaxone 1 g IVPB daily. Follow-up blood culture and sensitivity. Transferring to [facility] for hand surgery evaluation. Will consult transition of care team.  Active Problems:   ADD (attention deficit disorder) Not currently on therapy.    Mild protein malnutrition (HCC) In the setting of IVDA/acute infection. May benefit from protein supplementation. Consider nutritional services evaluation. Follow-up albumin level.    Advance Care Planning:   Code Status: Full Code   Consults:   Family Communication:   Severity of Illness: The appropriate patient status for this patient is INPATIENT. Inpatient status is judged to be reasonable and necessary in order to provide the required intensity of service to ensure the patient's safety. The patient's presenting symptoms, physical exam findings, and initial radiographic and laboratory data in the context of their chronic comorbidities is felt to place them at high risk for further clinical deterioration. Furthermore, it is not anticipated that the patient will be medically stable for discharge from the hospital within 2 midnights of admission.   * I certify that at the point of admission it is my clinical judgment that the  patient will require inpatient hospital care spanning beyond 2 midnights from the point of admission due to high intensity of service, high risk for further deterioration and high frequency of surveillance required.*  Author: Danice Dural, MD 06/05/2023 8:11 AM  For on call review [email or web address].   This document was prepared using Dragon voice recognition software and may contain some unintended transcription errors.

## 2023-06-10 LAB — CULTURE, BLOOD (ROUTINE X 2): Culture: NO GROWTH

## 2023-06-11 LAB — AEROBIC CULTURE W GRAM STAIN (SUPERFICIAL SPECIMEN): Gram Stain: NONE SEEN

## 2023-06-12 ENCOUNTER — Telehealth (HOSPITAL_BASED_OUTPATIENT_CLINIC_OR_DEPARTMENT_OTHER): Payer: Self-pay | Admitting: *Deleted

## 2023-06-12 NOTE — Telephone Encounter (Signed)
 Post ED Visit - Positive Culture Follow-up: Unsuccessful Patient Follow-up  Culture assessed and recommendations reviewed by:  []  Court Distance, Pharm.D. []  Skeet Duke, Pharm.D., BCPS AQ-ID []  Leslee Rase, Pharm.D., BCPS []  Garland Junk, Pharm.D., BCPS []  [place], [address].D., BCPS, AAHIVP []  Alcide Aly, Pharm.D., BCPS, AAHIVP []  Alejo Hurter, PharmD []  Thomasine Flick, PharmD, BCPS  Positive aerobic culture  [x]  Patient discharged without antimicrobial prescription and treatment is now indicated []  Organism is resistant to prescribed ED discharge antimicrobial []  Patient with positive blood cultures  Start Cefadroxil 500mg  PO BID x 7 days per Valentina Gasman, PA-C  Unable to contact patient , letter will be sent to address on file  Georgine Kitchens 06/12/2023, 11:06 AM
